# Patient Record
Sex: Female | Born: 1960 | Race: White | Hispanic: No | Marital: Married | Smoking: Former smoker
Health system: Southern US, Community
[De-identification: ages and names within clinical notes are randomized; demographics above are authoritative.]

## PROBLEM LIST (undated history)

## (undated) DIAGNOSIS — G4733 Obstructive sleep apnea (adult) (pediatric): Secondary | ICD-10-CM

## (undated) DIAGNOSIS — E785 Hyperlipidemia, unspecified: Secondary | ICD-10-CM

## (undated) DIAGNOSIS — I4891 Unspecified atrial fibrillation: Secondary | ICD-10-CM

## (undated) HISTORY — PX: TONSILLECTOMY: SUR1361

## (undated) HISTORY — PX: WISDOM TOOTH EXTRACTION: SHX21

## (undated) HISTORY — PX: BREAST EXCISIONAL BIOPSY: SUR124

## (undated) HISTORY — DX: Hyperlipidemia, unspecified: E78.5

## (undated) HISTORY — PX: TUBAL LIGATION: SHX77

---

## 2020-01-12 ENCOUNTER — Ambulatory Visit (INDEPENDENT_AMBULATORY_CARE_PROVIDER_SITE_OTHER): Payer: 59 | Admitting: Primary Care

## 2020-01-12 ENCOUNTER — Encounter (INDEPENDENT_AMBULATORY_CARE_PROVIDER_SITE_OTHER): Payer: Self-pay | Admitting: Primary Care

## 2020-01-12 ENCOUNTER — Other Ambulatory Visit: Payer: Self-pay

## 2020-01-12 VITALS — BP 135/82 | HR 74 | Temp 97.3°F | Ht 66.5 in | Wt 300.6 lb

## 2020-01-12 DIAGNOSIS — Z1211 Encounter for screening for malignant neoplasm of colon: Secondary | ICD-10-CM

## 2020-01-12 DIAGNOSIS — Z23 Encounter for immunization: Secondary | ICD-10-CM | POA: Diagnosis not present

## 2020-01-12 DIAGNOSIS — Z1231 Encounter for screening mammogram for malignant neoplasm of breast: Secondary | ICD-10-CM

## 2020-01-12 DIAGNOSIS — R030 Elevated blood-pressure reading, without diagnosis of hypertension: Secondary | ICD-10-CM

## 2020-01-12 DIAGNOSIS — Z0000 Encounter for general adult medical examination without abnormal findings: Secondary | ICD-10-CM

## 2020-01-12 NOTE — Patient Instructions (Addendum)
Influenza, Adult Influenza is also called "the flu." It is an infection in the lungs, nose, and throat (respiratory tract). It is caused by a virus. The flu causes symptoms that are similar to symptoms of a cold. It also causes a high fever and body aches. The flu spreads easily from person to person (is contagious). Getting a flu shot (influenza vaccination) every year is the best way to prevent the flu. What are the causes? This condition is caused by the influenza virus. You can get the virus by:  Breathing in droplets that are in the air from the cough or sneeze of a person who has the virus.  Touching something that has the virus on it (is contaminated) and then touching your mouth, nose, or eyes. What increases the risk? Certain things may make you more likely to get the flu. These include:  Not washing your hands often.  Having close contact with many people during cold and flu season.  Touching your mouth, eyes, or nose without first washing your hands.  Not getting a flu shot every year. You may have a higher risk for the flu, along with serious problems such as a lung infection (pneumonia), if you:  Are older than 65.  Are pregnant.  Have a weakened disease-fighting system (immune system) because of a disease or taking certain medicines.  Have a long-term (chronic) illness, such as: ? Heart, kidney, or lung disease. ? Diabetes. ? Asthma.  Have a liver disorder.  Are very overweight (morbidly obese).  Have anemia. This is a condition that affects your red blood cells. What are the signs or symptoms? Symptoms usually begin suddenly and last 4-14 days. They may include:  Fever and chills.  Headaches, body aches, or muscle aches.  Sore throat.  Cough.  Runny or stuffy (congested) nose.  Chest discomfort.  Not wanting to eat as much as normal (poor appetite).  Weakness or feeling tired (fatigue).  Dizziness.  Feeling sick to your stomach (nauseous) or  throwing up (vomiting). How is this treated? If the flu is found early, you can be treated with medicine that can help reduce how bad the illness is and how long it lasts (antiviral medicine). This may be given by mouth (orally) or through an IV tube. Taking care of yourself at home can help your symptoms get better. Your doctor may suggest:  Taking over-the-counter medicines.  Drinking plenty of fluids. The flu often goes away on its own. If you have very bad symptoms or other problems, you may be treated in a hospital. Follow these instructions at home:     Activity  Rest as needed. Get plenty of sleep.  Stay home from work or school as told by your doctor. ? Do not leave home until you do not have a fever for 24 hours without taking medicine. ? Leave home only to visit your doctor. Eating and drinking  Take an ORS (oral rehydration solution). This is a drink that is sold at pharmacies and stores.  Drink enough fluid to keep your pee (urine) pale yellow.  Drink clear fluids in small amounts as you are able. Clear fluids include: ? Water. ? Ice chips. ? Fruit juice that has water added (diluted fruit juice). ? Low-calorie sports drinks.  Eat bland, easy-to-digest foods in small amounts as you are able. These foods include: ? Bananas. ? Applesauce. ? Rice. ? Lean meats. ? Toast. ? Crackers.  Do not eat or drink: ? Fluids that have a lot   of sugar or caffeine. ? Alcohol. ? Spicy or fatty foods. General instructions  Take over-the-counter and prescription medicines only as told by your doctor.  Use a cool mist humidifier to add moisture to the air in your home. This can make it easier for you to breathe.  Cover your mouth and nose when you cough or sneeze.  Wash your hands with soap and water often, especially after you cough or sneeze. If you cannot use soap and water, use alcohol-based hand sanitizer.  Keep all follow-up visits as told by your doctor. This is  important. How is this prevented?   Get a flu shot every year. You may get the flu shot in late summer, fall, or winter. Ask your doctor when you should get your flu shot.  Avoid contact with people who are sick during fall and winter (cold and flu season). Contact a doctor if:  You get new symptoms.  You have: ? Chest pain. ? Watery poop (diarrhea). ? A fever.  Your cough gets worse.  You start to have more mucus.  You feel sick to your stomach.  You throw up. Get help right away if you:  Have shortness of breath.  Have trouble breathing.  Have skin or nails that turn a bluish color.  Have very bad pain or stiffness in your neck.  Get a sudden headache.  Get sudden pain in your face or ear.  Cannot eat or drink without throwing up. Summary  Influenza ("the flu") is an infection in the lungs, nose, and throat. It is caused by a virus.  Take over-the-counter and prescription medicines only as told by your doctor.  Getting a flu shot every year is the best way to avoid getting the flu. This information is not intended to replace advice given to you by your health care provider. Make sure you discuss any questions you have with your health care provider. Document Revised: 06/24/2017 Document Reviewed: 06/24/2017 Elsevier Patient Education  [employer].  Preventing Hypertension Hypertension, commonly called high blood pressure, is when the force of blood pumping through the arteries is too strong. Arteries are blood vessels that carry blood from the heart throughout the body. Over time, hypertension can damage the arteries and decrease blood flow to important parts of the body, including the brain, heart, and kidneys. Often, hypertension does not cause symptoms until blood pressure is very high. For this reason, it is important to have your blood pressure checked on a regular basis. Hypertension can often be prevented with diet and lifestyle changes. If you  already have hypertension, you can control it with diet and lifestyle changes, as well as medicine. What nutrition changes can be made? Maintain a healthy diet. This includes:  Eating less salt (sodium). Ask your health care provider how much sodium is safe for you to have. The general recommendation is to consume less than 1 tsp (2,300 mg) of sodium a day. ? Do not add salt to your food. ? Choose low-sodium options when grocery shopping and eating out.  Limiting fats in your diet. You can do this by eating low-fat or fat-free dairy products and by eating less red meat.  Eating more fruits, vegetables, and whole grains. Make a goal to eat: ? 1-2 cups of fresh fruits and vegetables each day. ? 3-4 servings of whole grains each day.  Avoiding foods and beverages that have added sugars.  Eating fish that contain healthy fats (omega-3 fatty acids), such as mackerel or salmon.  If you need help putting together a healthy eating plan, try the DASH diet. This diet is high in fruits, vegetables, and whole grains. It is low in sodium, red meat, and added sugars. DASH stands for Dietary Approaches to Stop Hypertension. What lifestyle changes can be made?   Lose weight if you are overweight. Losing just 3?5% of your body weight can help prevent or control hypertension. ? For example, if your present weight is 200 lb (91 kg), a loss of 3-5% of your weight means losing 6-10 lb (2.7-4.5 kg). ? Ask your health care provider to help you with a diet and exercise plan to safely lose weight.  Get enough exercise. Do at least 150 minutes of moderate-intensity exercise each week. ? You could do this in short exercise sessions several times a day, or you could do longer exercise sessions a few times a week. For example, you could take a brisk 10-minute walk or bike ride, 3 times a day, for 5 days a week.  Find ways to reduce stress, such as exercising, meditating, listening to music, or taking a yoga class. If  you need help reducing stress, ask your health care provider.  Do not smoke. This includes e-cigarettes. Chemicals in tobacco and nicotine products raise your blood pressure each time you smoke. If you need help quitting, ask your health care provider.  Avoid alcohol. If you drink alcohol, limit alcohol intake to no more than 1 drink a day for nonpregnant women and 2 drinks a day for men. One drink equals 12 oz of beer, 5 oz of wine, or 1 oz of hard liquor. Why are these changes important? Diet and lifestyle changes can help you prevent hypertension, and they may make you feel better overall and improve your quality of life. If you have hypertension, making these changes will help you control it and help prevent major complications, such as:  Hardening and narrowing of arteries that supply blood to: ? Your heart. This can cause a heart attack. ? Your brain. This can cause a stroke. ? Your kidneys. This can cause kidney failure.  Stress on your heart muscle, which can cause heart failure. What can I do to lower my risk?  Work with your health care provider to make a hypertension prevention plan that works for you. Follow your plan and keep all follow-up visits as told by your health care provider.  Learn how to check your blood pressure at home. Make sure that you know your personal target blood pressure, as told by your health care provider. How is this treated? In addition to diet and lifestyle changes, your health care provider may recommend medicines to help lower your blood pressure. You may need to try a few different medicines to find what works best for you. You also may need to take more than one medicine. Take over-the-counter and prescription medicines only as told by your health care provider. Where to find support Your health care provider can help you prevent hypertension and help you keep your blood pressure at a healthy level. Your local hospital or your community may also  provide support services and prevention programs. The American Heart Association offers an online support network at: CheapBootlegs.com.cy Where to find more information Learn more about hypertension from:  [place], Lung, and Blood Institute: ElectronicHangman.is  Centers for Disease Control and Prevention: https://ingram.com/  American Academy of Family Physicians: http://familydoctor.org/familydoctor/en/diseases-conditions/high-blood-pressure.printerview.all.html Learn more about the DASH diet from:  [place], Lung, and Blood Institute: https://www.reyes.com/  Contact a health care provider if:  You think you are having a reaction to medicines you have taken.  You have recurrent headaches or feel dizzy.  You have swelling in your ankles.  You have trouble with your vision. Summary  Hypertension often does not cause any symptoms until blood pressure is very high. It is important to get your blood pressure checked regularly.  Diet and lifestyle changes are the most important steps in preventing hypertension.  By keeping your blood pressure in a healthy range, you can prevent complications like heart attack, heart failure, stroke, and kidney failure.  Work with your health care provider to make a hypertension prevention plan that works for you. This information is not intended to replace advice given to you by your health care provider. Make sure you discuss any questions you have with your health care provider. Document Revised: 04/30/2018 Document Reviewed: 09/17/2015 Elsevier Patient Education  2020 [employer].

## 2020-01-12 NOTE — Progress Notes (Signed)
New Patient Office Visit  Subjective:  Patient ID: Brittany Bauer, female    DOB: March 03, 1960  Age: 59 y.o. MRN: [id number]  CC:  Chief Complaint  Patient presents with  . New Patient (Initial Visit)    Weight     HPI [name] is a 59 is a morbid obese female who presents for establishment of care and her main concern is her weight but will defer to next year.  History reviewed. No pertinent past medical history.   Social History   Socioeconomic History  . Marital status: Married    Spouse name: Not on file  . Number of children: Not on file  . Years of education: Not on file  . Highest education level: Not on file  Occupational History  . Not on file  Tobacco Use  . Smoking status: Never Smoker  . Smokeless tobacco: Never Used  Substance and Sexual Activity  . Alcohol use: Yes  . Drug use: Never  . Sexual activity: Yes  Other Topics Concern  . Not on file  Social History Narrative  . Not on file   Social Determinants of Health   Financial Resource Strain: Not on file  Food Insecurity: Not on file  Transportation Needs: Not on file  Physical Activity: Not on file  Stress: Not on file  Social Connections: Not on file  Intimate Partner Violence: Not on file    ROS Review of Systems  All other systems reviewed and are negative.   Objective:   Today's Vitals: BP 135/82 (BP Location: Right Arm, Patient Position: Sitting, Cuff Size: Large)   Pulse 74   Temp (!) 97.3 F (36.3 C) (Temporal)   Ht 5' 6.5" (1.689 m)   Wt (!) 300 lb 9.6 oz (136.4 kg)   SpO2 93%   BMI 47.79 kg/m   Physical Exam Vitals reviewed.  Constitutional:      Appearance: She is obese.     Comments: morbid  HENT:     Head: Normocephalic.     Right Ear: Tympanic membrane and external ear normal.     Left Ear: Tympanic membrane and external ear normal.     Nose: Nose normal.  Eyes:     Extraocular Movements: Extraocular movements intact.     Pupils: Pupils are equal,  round, and reactive to light.  Cardiovascular:     Rate and Rhythm: Normal rate and regular rhythm.  Pulmonary:     Effort: Pulmonary effort is normal.     Breath sounds: Normal breath sounds.  Abdominal:     General: Bowel sounds are normal. There is distension.     Palpations: Abdomen is soft.  Musculoskeletal:        General: Normal range of motion.     Cervical back: Normal range of motion and neck supple.  Skin:    General: Skin is warm and dry.  Neurological:     Mental Status: She is alert and oriented to person, place, and time.  Psychiatric:        Mood and Affect: Mood normal.        Behavior: Behavior normal.        Thought Content: Thought content normal.        Judgment: Judgment normal.     Assessment & Plan:   Brittany Bauer was seen today for new patient (initial visit).  Diagnoses and all orders for this visit: Brittany Bauer was seen today for new patient (initial visit).  Diagnoses and all orders  for this visit:  Encounter for medical examination to establish care Establish care  -     CBC with Differential -     CMP14+EGFR  Colon cancer screening -     Ambulatory referral to Gastroenterology  Morbid obesity ([place]) Morbid Obesity is 30-39 indicating an excess in caloric intake or underlining conditions. This may lead to other co-morbidities. HTN, DM respiratory complication. Lifestyle modifications of diet and exercise may reduce obesity.  -     Lipid Panel -     TSH + free T4  Encounter for screening mammogram for malignant neoplasm of breast Refer for mammogram   Elevated blood pressure reading without diagnosis of hypertension. Discussed low sodium diet  -     CBC with Differential -     CMP14+EGFR  Need for prophylactic vaccination and inoculation against influenza completed  Follow-up: Return in about 1 month (around 02/12/2020) for pap/weight.   Kerin Perna, NP

## 2020-01-13 LAB — CBC WITH DIFFERENTIAL/PLATELET
Basophils Absolute: 0 10*3/uL (ref 0.0–0.2)
Basos: 1 %
EOS (ABSOLUTE): 0.1 10*3/uL (ref 0.0–0.4)
Eos: 1 %
Hematocrit: 48.5 % — ABNORMAL HIGH (ref 34.0–46.6)
Hemoglobin: 15.8 g/dL (ref 11.1–15.9)
Immature Grans (Abs): 0 10*3/uL (ref 0.0–0.1)
Immature Granulocytes: 0 %
Lymphocytes Absolute: 2.2 10*3/uL (ref 0.7–3.1)
Lymphs: 33 %
MCH: 27.4 pg (ref 26.6–33.0)
MCHC: 32.6 g/dL (ref 31.5–35.7)
MCV: 84 fL (ref 79–97)
Monocytes Absolute: 0.4 10*3/uL (ref 0.1–0.9)
Monocytes: 5 %
Neutrophils Absolute: 4.1 10*3/uL (ref 1.4–7.0)
Neutrophils: 60 %
Platelets: 238 10*3/uL (ref 150–450)
RBC: 5.76 x10E6/uL — ABNORMAL HIGH (ref 3.77–5.28)
RDW: 13.6 % (ref 11.7–15.4)
WBC: 6.9 10*3/uL (ref 3.4–10.8)

## 2020-01-13 LAB — LIPID PANEL
Chol/HDL Ratio: 4.4 ratio (ref 0.0–4.4)
Cholesterol, Total: 192 mg/dL (ref 100–199)
HDL: 44 mg/dL (ref >39)
LDL Chol Calc (NIH): 117 mg/dL — ABNORMAL HIGH (ref 0–99)
Triglycerides: 178 mg/dL — ABNORMAL HIGH (ref 0–149)
VLDL Cholesterol Cal: 31 mg/dL (ref 5–40)

## 2020-01-13 LAB — CMP14+EGFR
ALT: 16 IU/L (ref 0–32)
AST: 16 IU/L (ref 0–40)
Albumin/Globulin Ratio: 1.7 (ref 1.2–2.2)
Albumin: 4.2 g/dL (ref 3.8–4.9)
Alkaline Phosphatase: 90 IU/L (ref 44–121)
BUN/Creatinine Ratio: 18 (ref 12–28)
BUN: 16 mg/dL (ref 8–27)
Bilirubin Total: 0.5 mg/dL (ref 0.0–1.2)
CO2: 23 mmol/L (ref 20–29)
Calcium: 9.1 mg/dL (ref 8.7–10.3)
Chloride: 104 mmol/L (ref 96–106)
Creatinine, Ser: 0.9 mg/dL (ref 0.57–1.00)
GFR calc Af Amer: 80 mL/min/{1.73_m2} (ref >59)
GFR calc non Af Amer: 70 mL/min/{1.73_m2} (ref >59)
Globulin, Total: 2.5 g/dL (ref 1.5–4.5)
Glucose: 108 mg/dL — ABNORMAL HIGH (ref 65–99)
Potassium: 4.8 mmol/L (ref 3.5–5.2)
Sodium: 141 mmol/L (ref 134–144)
Total Protein: 6.7 g/dL (ref 6.0–8.5)

## 2020-01-13 LAB — TSH+FREE T4
Free T4: 1.27 ng/dL (ref 0.82–1.77)
TSH: 4.33 u[IU]/mL (ref 0.450–4.500)

## 2020-01-20 ENCOUNTER — Other Ambulatory Visit (INDEPENDENT_AMBULATORY_CARE_PROVIDER_SITE_OTHER): Payer: Self-pay | Admitting: Primary Care

## 2020-01-20 DIAGNOSIS — E782 Mixed hyperlipidemia: Secondary | ICD-10-CM

## 2020-01-20 MED ORDER — ATORVASTATIN CALCIUM 20 MG PO TABS
20.0000 mg | ORAL_TABLET | Freq: Every day | ORAL | 3 refills | Status: DC
Start: 2020-01-20 — End: 2020-08-16

## 2020-01-21 HISTORY — PX: COLONOSCOPY: SHX174

## 2020-01-26 ENCOUNTER — Encounter: Payer: Self-pay | Admitting: Gastroenterology

## 2020-02-13 ENCOUNTER — Ambulatory Visit (INDEPENDENT_AMBULATORY_CARE_PROVIDER_SITE_OTHER): Payer: 59 | Admitting: Physician Assistant

## 2020-02-13 ENCOUNTER — Other Ambulatory Visit: Payer: Self-pay

## 2020-02-13 ENCOUNTER — Encounter (INDEPENDENT_AMBULATORY_CARE_PROVIDER_SITE_OTHER): Payer: Self-pay

## 2020-02-13 ENCOUNTER — Other Ambulatory Visit (HOSPITAL_COMMUNITY)
Admission: RE | Admit: 2020-02-13 | Discharge: 2020-02-13 | Disposition: A | Discharge status: Home / Self Care | Payer: 59 | Source: Ambulatory Visit | Attending: Primary Care | Admitting: Primary Care

## 2020-02-13 VITALS — BP 121/80 | HR 118 | Temp 98.7°F | Resp 20 | Ht 67.0 in | Wt 300.0 lb

## 2020-02-13 DIAGNOSIS — Z124 Encounter for screening for malignant neoplasm of cervix: Secondary | ICD-10-CM | POA: Diagnosis not present

## 2020-02-13 DIAGNOSIS — Z01419 Encounter for gynecological examination (general) (routine) without abnormal findings: Secondary | ICD-10-CM

## 2020-02-13 DIAGNOSIS — E66813 Obesity, class 3: Secondary | ICD-10-CM | POA: Insufficient documentation

## 2020-02-13 DIAGNOSIS — Z1231 Encounter for screening mammogram for malignant neoplasm of breast: Secondary | ICD-10-CM | POA: Diagnosis not present

## 2020-02-13 DIAGNOSIS — Z6841 Body Mass Index (BMI) 40.0 and over, adult: Secondary | ICD-10-CM

## 2020-02-13 DIAGNOSIS — L309 Dermatitis, unspecified: Secondary | ICD-10-CM

## 2020-02-13 MED ORDER — NYSTATIN-TRIAMCINOLONE 100000-0.1 UNIT/GM-% EX OINT: 1.0000 "application " | TOPICAL_OINTMENT | Freq: Two times a day (BID) | CUTANEOUS | 0 refills | Status: DC

## 2020-02-13 NOTE — Patient Instructions (Addendum)
Mindfulness, I encourage you to eat on distracted, no TV, no social media  Ask, Am I hungry?  Can be very helpful to determine if you are truly hungry, or you just thirsty, are you bored, looking for rest?  My Fitness Pal can be very helpful for tracking calorie intake  Referral for your mammogram, we will call you with your results from your Pap smear.  Kennieth Rad, PA-C Physician Assistant [place] [email or web address]    Calorie Counting for Weight Loss Calories are units of energy. Your body needs a certain number of calories from food to keep going throughout the day. When you eat or drink more calories than your body needs, your body stores the extra calories mostly as fat. When you eat or drink fewer calories than your body needs, your body burns fat to get the energy it needs. Calorie counting means keeping track of how many calories you eat and drink each day. Calorie counting can be helpful if you need to lose weight. If you eat fewer calories than your body needs, you should lose weight. Ask your health care provider what a healthy weight is for you. For calorie counting to work, you will need to eat the right number of calories each day to lose a healthy amount of weight per week. A dietitian can help you figure out how many calories you need in a day and will suggest ways to reach your calorie goal.  A healthy amount of weight to lose each week is usually 1-2 lb (0.5-0.9 kg). This usually means that your daily calorie intake should be reduced by 500-750 calories.  Eating 1,200-1,500 calories a day can help most women lose weight.  Eating 1,500-1,800 calories a day can help most men lose weight. What do I need to know about calorie counting? Work with your health care provider or dietitian to determine how many calories you should get each day. To meet your daily calorie goal, you will need to:  Find out how many  calories are in each food that you would like to eat. Try to do this before you eat.  Decide how much of the food you plan to eat.  Keep a food log. Do this by writing down what you ate and how many calories it had. To successfully lose weight, it is important to balance calorie counting with a healthy lifestyle that includes regular activity. Where do I find calorie information? The number of calories in a food can be found on a Nutrition Facts label. If a food does not have a Nutrition Facts label, try to look up the calories online or ask your dietitian for help. Remember that calories are listed per serving. If you choose to have more than one serving of a food, you will have to multiply the calories per serving by the number of servings you plan to eat. For example, the label on a package of bread might say that a serving size is 1 slice and that there are 90 calories in a serving. If you eat 1 slice, you will have eaten 90 calories. If you eat 2 slices, you will have eaten 180 calories.   How do I keep a food log? After each time that you eat, record the following in your food log as soon as possible:  What you ate. Be sure to include toppings, sauces, and other extras on the food.  How much you ate. This can be measured in cups, ounces, or  number of items.  How many calories were in each food and drink.  The total number of calories in the food you ate. Keep your food log near you, such as in a pocket-sized notebook or on an app or website on your mobile phone. Some programs will calculate calories for you and show you how many calories you have left to meet your daily goal. What are some portion-control tips?  Know how many calories are in a serving. This will help you know how many servings you can have of a certain food.  Use a measuring cup to measure serving sizes. You could also try weighing out portions on a kitchen scale. With time, you will be able to estimate serving sizes  for some foods.  Take time to put servings of different foods on your favorite plates or in your favorite bowls and cups so you know what a serving looks like.  Try not to eat straight from a food's packaging, such as from a bag or box. Eating straight from the package makes it hard to see how much you are eating and can lead to overeating. Put the amount you would like to eat in a cup or on a plate to make sure you are eating the right portion.  Use smaller plates, glasses, and bowls for smaller portions and to prevent overeating.  Try not to multitask. For example, avoid watching TV or using your computer while eating. If it is time to eat, sit down at a table and enjoy your food. This will help you recognize when you are full. It will also help you be more mindful of what and how much you are eating. What are tips for following this plan? Reading food labels  Check the calorie count compared with the serving size. The serving size may be smaller than what you are used to eating.  Check the source of the calories. Try to choose foods that are high in protein, fiber, and vitamins, and low in saturated fat, trans fat, and sodium. Shopping  Read nutrition labels while you shop. This will help you make healthy decisions about which foods to buy.  Pay attention to nutrition labels for low-fat or fat-free foods. These foods sometimes have the same number of calories or more calories than the full-fat versions. They also often have added sugar, starch, or salt to make up for flavor that was removed with the fat.  Make a grocery list of lower-calorie foods and stick to it. Cooking  Try to cook your favorite foods in a healthier way. For example, try baking instead of frying.  Use low-fat dairy products. Meal planning  Use more fruits and vegetables. One-half of your plate should be fruits and vegetables.  Include lean proteins, such as chicken, [place], and fish. Lifestyle Each week, aim to  do one of the following:  150 minutes of moderate exercise, such as walking.  75 minutes of vigorous exercise, such as running. General information  Know how many calories are in the foods you eat most often. This will help you calculate calorie counts faster.  Find a way of tracking calories that works for you. Get creative. Try different apps or programs if writing down calories does not work for you. What foods should I eat?  Eat nutritious foods. It is better to have a nutritious, high-calorie food, such as an avocado, than a food with few nutrients, such as a bag of potato chips.  Use your calories on foods  and drinks that will fill you up and will not leave you hungry soon after eating. ? Examples of foods that fill you up are nuts and nut butters, vegetables, lean proteins, and high-fiber foods such as whole grains. High-fiber foods are foods with more than 5 g of fiber per serving.  Pay attention to calories in drinks. Low-calorie drinks include water and unsweetened drinks. The items listed above may not be a complete list of foods and beverages you can eat. Contact a dietitian for more information.   What foods should I limit? Limit foods or drinks that are not good sources of vitamins, minerals, or protein or that are high in unhealthy fats. These include:  Candy.  Other sweets.  Sodas, specialty coffee drinks, alcohol, and juice. The items listed above may not be a complete list of foods and beverages you should avoid. Contact a dietitian for more information. How do I count calories when eating out?  Pay attention to portions. Often, portions are much larger when eating out. Try these tips to keep portions smaller: ? Consider sharing a meal instead of getting your own. ? If you get your own meal, eat only half of it. Before you start eating, ask for a container and put half of your meal into it. ? When available, consider ordering smaller portions from the menu instead  of full portions.  Pay attention to your food and drink choices. Knowing the way food is cooked and what is included with the meal can help you eat fewer calories. ? If calories are listed on the menu, choose the lower-calorie options. ? Choose dishes that include vegetables, fruits, whole grains, low-fat dairy products, and lean proteins. ? Choose items that are boiled, broiled, grilled, or steamed. Avoid items that are buttered, battered, fried, or served with cream sauce. Items labeled as crispy are usually fried, unless stated otherwise. ? Choose water, low-fat milk, unsweetened iced tea, or other drinks without added sugar. If you want an alcoholic beverage, choose a lower-calorie option, such as a glass of wine or light beer. ? Ask for dressings, sauces, and syrups on the side. These are usually high in calories, so you should limit the amount you eat. ? If you want a salad, choose a garden salad and ask for grilled meats. Avoid extra toppings such as bacon, cheese, or fried items. Ask for the dressing on the side, or ask for olive oil and vinegar or lemon to use as dressing.  Estimate how many servings of a food you are given. Knowing serving sizes will help you be aware of how much food you are eating at restaurants. Where to find more information  Centers for Disease Control and Prevention: http://www.wolf.info/  [place]. Department of Agriculture: http://www.wilson-[place].org/ Summary  Calorie counting means keeping track of how many calories you eat and drink each day. If you eat fewer calories than your body needs, you should lose weight.  A healthy amount of weight to lose per week is usually 1-2 lb (0.5-0.9 kg). This usually means reducing your daily calorie intake by 500-750 calories.  The number of calories in a food can be found on a Nutrition Facts label. If a food does not have a Nutrition Facts label, try to look up the calories online or ask your dietitian for help.  Use smaller plates, glasses, and  bowls for smaller portions and to prevent overeating.  Use your calories on foods and drinks that will fill you up and not leave you  hungry shortly after a meal. This information is not intended to replace advice given to you by your health care provider. Make sure you discuss any questions you have with your health care provider. Document Revised: 02/17/2019 Document Reviewed: 02/17/2019 Elsevier Patient Education  2021 [employer].

## 2020-02-13 NOTE — Progress Notes (Unsigned)
Subjective:     Established Patient Office Visit  Subjective:  Patient ID: Brittany Bauer, female    DOB: 1960/11/05  Age: 60 y.o. MRN: [id number]  CC:  Chief Complaint  Patient presents with  . Gynecologic Exam    HPI Brittany Bauer is a 60 y.o. woman who comes in today for a  pap smear only. Her most recent annual exam was 3 to 4 years ago per patient self history.  Denies any previous abnormal Paps.no complaints today.  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  Reports that she would like to lose approximately 100 pounds.  Reports that she has tried and failed many diets in the past, states that she is drinking approximately 60 ounces of water a day, is sleeping 7 to 8 hours, is interested in starting some type of exercise.   History reviewed. No pertinent past medical history.  History reviewed. No pertinent surgical history.  History reviewed. No pertinent family history.  Social History   Socioeconomic History  . Marital status: Married    Spouse name: Not on file  . Number of children: Not on file  . Years of education: Not on file  . Highest education level: Not on file  Occupational History  . Not on file  Tobacco Use  . Smoking status: Never Smoker  . Smokeless tobacco: Never Used  Substance and Sexual Activity  . Alcohol use: Yes  . Drug use: Never  . Sexual activity: Yes  Other Topics Concern  . Not on file  Social History Narrative  . Not on file   Social Determinants of Health   Financial Resource Strain: Not on file  Food Insecurity: Not on file  Transportation Needs: Not on file  Physical Activity: Not on file  Stress: Not on file  Social Connections: Not on file  Intimate Partner Violence: Not on file    Outpatient Medications Prior to Visit  Medication Sig Dispense Refill  . atorvastatin (LIPITOR) 20 MG tablet Take 1 tablet (20 mg total) by mouth  daily. 90 tablet 3   No facility-administered medications prior to visit.    No Known Allergies  ROS Review of Systems  Constitutional: Negative.   HENT: Negative.   Eyes: Negative.   Respiratory: Negative.   Cardiovascular: Negative.   Gastrointestinal: Negative.   Endocrine: Negative.   Genitourinary: Negative for difficulty urinating, genital sores and vaginal discharge.  Musculoskeletal: Negative.   Skin: Negative.   Allergic/Immunologic: Negative.   Neurological: Negative.   Hematological: Negative.   Psychiatric/Behavioral: Negative.       Objective:    Physical Exam Exam conducted with a chaperone present.  Constitutional:      General: She is not in acute distress.    Appearance: Normal appearance. She is obese. She is not ill-appearing.  HENT:     Head: Normocephalic and atraumatic.     Right Ear: External ear normal.     Left Ear: External ear normal.     Nose: Nose normal.     Mouth/Throat:     Mouth: Mucous membranes are moist.     Pharynx: Oropharynx is clear.  Eyes:     Extraocular Movements: Extraocular movements intact.     Conjunctiva/sclera: Conjunctivae normal.     Pupils: Pupils are equal, round, and reactive to light.  Cardiovascular:     Rate and Rhythm: Normal rate and regular rhythm.  Pulses: Normal pulses.     Heart sounds: Normal heart sounds.  Pulmonary:     Effort: Pulmonary effort is normal.     Breath sounds: Normal breath sounds.  Abdominal:     General: Abdomen is flat.     Palpations: Abdomen is soft.  Genitourinary:    General: Normal vulva.     Pubic Area: Rash present.     Labia:        Right: Rash present.        Left: Rash present.      Vagina: Normal. No vaginal discharge.     Cervix: Normal.     Uterus: Normal.      Rectum: Normal. No external hemorrhoid.        Comments: Generalized rash inner thighs; erythema, non-purulent  Unable to palpate ovaries Musculoskeletal:        General: Normal range of  motion.     Cervical back: Normal range of motion and neck supple.  Skin:    General: Skin is warm and dry.  Neurological:     General: No focal deficit present.     Mental Status: She is alert. Mental status is at baseline. She is disoriented.  Psychiatric:        Mood and Affect: Mood normal.        Behavior: Behavior normal.        Thought Content: Thought content normal.        Judgment: Judgment normal.     BP 121/80 (BP Location: Left Arm, Patient Position: Sitting, Cuff Size: Large)   Pulse (!) 118   Temp 98.7 F (37.1 C) (Oral)   Resp 20   Ht 5\' 7"  (1.702 m)   Wt 300 lb (136.1 kg)   SpO2 90%   BMI 46.99 kg/m  Wt Readings from Last 3 Encounters:  02/13/20 300 lb (136.1 kg)  01/12/20 (!) 300 lb 9.6 oz (136.4 kg)     Health Maintenance Due  Topic Date Due  . HIV Screening  Never done  . TETANUS/TDAP  Never done  . PAP SMEAR-Modifier  Never done  . COLONOSCOPY (Pts 45-22yrs Insurance coverage will need to be confirmed)  Never done  . MAMMOGRAM  Never done  . INFLUENZA VACCINE  Never done    There are no preventive care reminders to display for this patient.  Lab Results  Component Value Date   TSH 4.330 01/12/2020   Lab Results  Component Value Date   WBC 6.9 01/12/2020   HGB 15.8 01/12/2020   HCT 48.5 (H) 01/12/2020   MCV 84 01/12/2020   PLT 238 01/12/2020   Lab Results  Component Value Date   NA 141 01/12/2020   K 4.8 01/12/2020   CO2 23 01/12/2020   GLUCOSE 108 (H) 01/12/2020   BUN 16 01/12/2020   CREATININE 0.90 01/12/2020   BILITOT 0.5 01/12/2020   ALKPHOS 90 01/12/2020   AST 16 01/12/2020   ALT 16 01/12/2020   PROT 6.7 01/12/2020   ALBUMIN 4.2 01/12/2020   CALCIUM 9.1 01/12/2020   Lab Results  Component Value Date   CHOL 192 01/12/2020   Lab Results  Component Value Date   HDL 44 01/12/2020   Lab Results  Component Value Date   LDLCALC 117 (H) 01/12/2020   Lab Results  Component Value Date   TRIG 178 (H) 01/12/2020   Lab  Results  Component Value Date   CHOLHDL 4.4 01/12/2020   No results found for: HGBA1C  Assessment & Plan:   Problem List Items Addressed This Visit      Other   Morbid obesity ([place])    Other Visit Diagnoses    Encounter for screening for cervical cancer    -  Primary   Relevant Orders   Cytology - PAP([place])   Screening for malignant neoplasm of cervix       Encounter for screening mammogram for malignant neoplasm of breast       Relevant Orders   MM DIGITAL SCREENING BILATERAL   Dermatitis       Relevant Medications   nystatin-triamcinolone ointment (MYCOLOG)    1. Encounter for screening for cervical cancer Patient education given on weight loss techniques, including mindfulness, increasing water intake, increasing activity. - Cytology - PAP([place])  2. Screening for malignant neoplasm of cervix   3. Morbid obesity ([place])   4. Encounter for screening mammogram for malignant neoplasm of breast  - MM DIGITAL SCREENING BILATERAL; Future  5. Dermatitis Patient education given, keep area clean and dry - nystatin-triamcinolone ointment (MYCOLOG); Apply 1 application topically 2 (two) times daily.  Dispense: 30 g; Refill: 0   Meds ordered this encounter  Medications  . nystatin-triamcinolone ointment (MYCOLOG)    Sig: Apply 1 application topically 2 (two) times daily.    Dispense:  30 g    Refill:  0    Order Specific Question:   Supervising Provider    Answer:   Noralyn Pick    Follow-up: Return in about 6 months (around 08/12/2020).    Loraine Grip Lene Mckay, PA-C

## 2020-02-13 NOTE — Progress Notes (Signed)
Patient presents for PAP and weight check. Patient has eaten today. Patient has not taken any medication today. Patient denies any pain at this time.

## 2020-02-15 LAB — CYTOLOGY - PAP
Chlamydia: NEGATIVE
Comment: NEGATIVE
Comment: NEGATIVE
Comment: NORMAL
Diagnosis: NEGATIVE
Neisseria Gonorrhea: NEGATIVE
Trichomonas: NEGATIVE

## 2020-02-19 ENCOUNTER — Telehealth: Payer: Self-pay | Admitting: *Deleted

## 2020-02-19 NOTE — Telephone Encounter (Signed)
-----   Message from Kennieth Rad, [place] sent at 02/16/2020  1:16 PM EST ----- Please call patient and let her know that her Pap cytology was within normal limits, because she has never had an abnormal result, it is recommended that she have a repeat Pap completed in 3 years.

## 2020-02-19 NOTE — Telephone Encounter (Signed)
Medical Assistant left message on patient's home and cell voicemail. Voicemail states to give a call back to [place] with [facility] at [phone]. Patient is aware of PAP being normal and a recommended standard recheck of 3 years is recommended. Patient has also viewed results via mychart.

## 2020-03-05 ENCOUNTER — Other Ambulatory Visit: Payer: Self-pay

## 2020-03-05 ENCOUNTER — Ambulatory Visit (AMBULATORY_SURGERY_CENTER): Payer: Self-pay

## 2020-03-05 VITALS — Ht 67.0 in | Wt 303.0 lb

## 2020-03-05 DIAGNOSIS — Z1211 Encounter for screening for malignant neoplasm of colon: Secondary | ICD-10-CM

## 2020-03-05 MED ORDER — SUTAB 1479-225-188 MG PO TABS: 1.0000 | ORAL_TABLET | ORAL | 0 refills | Status: DC

## 2020-03-05 NOTE — Progress Notes (Signed)
No egg or soy allergy known to patient  No issues with past sedation with any surgeries or procedures No intubation problems in the past  No FH of Malignant Hyperthermia No diet pills per patient No home 02 use per patient  No blood thinners per patient  Pt denies issues with constipation  No A fib or A flutter  EMMI video via MyChart  COVID 19 guidelines implemented in PV today with Pt and RN  Pt is fully vaccinated for Covid x 2 + booster= Pt denies loose or missing teeth; Patient denies partials, capped or bonded teeth;  Patient reports dentures and dental implants (lower). Coupon given to pt in PV today, Code to Pharmacy and  NO PA's for preps discussed with pt in PV today  Discussed with pt there will be an out-of-pocket cost for prep and that varies from $0 to 70 dollars  Due to the COVID-19 pandemic we are asking patients to follow certain guidelines.  Pt aware of COVID protocols and [facility] guidelines

## 2020-03-14 ENCOUNTER — Encounter: Payer: Self-pay | Admitting: Gastroenterology

## 2020-03-19 ENCOUNTER — Telehealth: Payer: Self-pay | Admitting: Gastroenterology

## 2020-03-19 ENCOUNTER — Encounter: Payer: Self-pay | Admitting: Gastroenterology

## 2020-03-19 ENCOUNTER — Emergency Department (HOSPITAL_COMMUNITY): Payer: 59

## 2020-03-19 ENCOUNTER — Other Ambulatory Visit: Payer: Self-pay

## 2020-03-19 ENCOUNTER — Telehealth (HOSPITAL_COMMUNITY): Payer: Self-pay | Admitting: Physician Assistant

## 2020-03-19 ENCOUNTER — Emergency Department (HOSPITAL_COMMUNITY)
Admission: EM | Admit: 2020-03-19 | Discharge: 2020-03-19 | Disposition: A | Discharge status: Home / Self Care | Payer: 59 | Attending: Emergency Medicine | Admitting: Emergency Medicine

## 2020-03-19 ENCOUNTER — Encounter (HOSPITAL_COMMUNITY): Payer: Self-pay

## 2020-03-19 ENCOUNTER — Ambulatory Visit (AMBULATORY_SURGERY_CENTER): Payer: 59 | Admitting: Gastroenterology

## 2020-03-19 VITALS — BP 103/66 | HR 101 | Temp 96.2°F | Resp 10 | Ht 67.0 in | Wt 303.0 lb

## 2020-03-19 DIAGNOSIS — K635 Polyp of colon: Secondary | ICD-10-CM

## 2020-03-19 DIAGNOSIS — Z1211 Encounter for screening for malignant neoplasm of colon: Secondary | ICD-10-CM

## 2020-03-19 DIAGNOSIS — I480 Paroxysmal atrial fibrillation: Secondary | ICD-10-CM | POA: Diagnosis not present

## 2020-03-19 DIAGNOSIS — D125 Benign neoplasm of sigmoid colon: Secondary | ICD-10-CM | POA: Diagnosis not present

## 2020-03-19 DIAGNOSIS — D124 Benign neoplasm of descending colon: Secondary | ICD-10-CM

## 2020-03-19 DIAGNOSIS — D120 Benign neoplasm of cecum: Secondary | ICD-10-CM | POA: Diagnosis not present

## 2020-03-19 DIAGNOSIS — R002 Palpitations: Secondary | ICD-10-CM | POA: Diagnosis present

## 2020-03-19 DIAGNOSIS — D126 Benign neoplasm of colon, unspecified: Secondary | ICD-10-CM | POA: Diagnosis not present

## 2020-03-19 DIAGNOSIS — D123 Benign neoplasm of transverse colon: Secondary | ICD-10-CM

## 2020-03-19 LAB — CBC
HCT: 49.7 % — ABNORMAL HIGH (ref 36.0–46.0)
Hemoglobin: 15.6 g/dL — ABNORMAL HIGH (ref 12.0–15.0)
MCH: 27.3 pg (ref 26.0–34.0)
MCHC: 31.4 g/dL (ref 30.0–36.0)
MCV: 86.9 fL (ref 80.0–100.0)
Platelets: 204 10*3/uL (ref 150–400)
RBC: 5.72 MIL/uL — ABNORMAL HIGH (ref 3.87–5.11)
RDW: 14.5 % (ref 11.5–15.5)
WBC: 6.7 10*3/uL (ref 4.0–10.5)
nRBC: 0 % (ref 0.0–0.2)

## 2020-03-19 LAB — BASIC METABOLIC PANEL
Anion gap: 12 (ref 5–15)
BUN: 9 mg/dL (ref 8–23)
CO2: 24 mmol/L (ref 22–32)
Calcium: 8.8 mg/dL — ABNORMAL LOW (ref 8.9–10.3)
Chloride: 103 mmol/L (ref 98–111)
Creatinine, Ser: 0.83 mg/dL (ref 0.44–1.00)
GFR, Estimated: >60 mL/min (ref >60)
Glucose, Bld: 99 mg/dL (ref 70–99)
Potassium: 4 mmol/L (ref 3.5–5.1)
Sodium: 139 mmol/L (ref 135–145)

## 2020-03-19 MED ORDER — METOPROLOL SUCCINATE ER 50 MG PO TB24
25.0000 mg | ORAL_TABLET | Freq: Once | ORAL | Status: AC
  Administered 2020-03-19: 25 mg via ORAL
  Filled 2020-03-19: qty 1

## 2020-03-19 MED ORDER — SODIUM CHLORIDE 0.9 % IV SOLN: 500.0000 mL | Freq: Once | INTRAVENOUS | Status: DC

## 2020-03-19 MED ORDER — METOPROLOL SUCCINATE ER 25 MG PO TB24: 25.0000 mg | ORAL_TABLET | Freq: Every day | ORAL | 0 refills | Status: DC

## 2020-03-19 NOTE — Progress Notes (Signed)
Procedure end VSS. Report to RN. Dr Tarri Glenn aware of HR and rhythm will get 12 lead in PACU for confirmation.tb

## 2020-03-19 NOTE — Progress Notes (Signed)
Called to room to assist during endoscopic procedure.  Patient ID and intended procedure confirmed with present staff. Received instructions for my participation in the procedure from the performing physician.  

## 2020-03-19 NOTE — Op Note (Addendum)
[place] Patient Name: Aron Needles Procedure Date: 03/19/2020 10:53 AM MRN: [id number] Endoscopist: Thornton Park MD, MD Age: 60 Referring MD:  Date of Birth: 10-08-1960 Gender: Female Account #: [id number] Procedure:                Colonoscopy Indications:              Screening for colorectal malignant neoplasm, This                            is the patient's first colonoscopy                           No known family history of colon cancer or polyps. Medicines:                Monitored Anesthesia Care Procedure:                Pre-Anesthesia Assessment:                           - Prior to the procedure, a History and Physical                            was performed, and patient medications and                            allergies were reviewed. The patient's tolerance of                            previous anesthesia was also reviewed. The risks                            and benefits of the procedure and the sedation                            options and risks were discussed with the patient.                            All questions were answered, and informed consent                            was obtained. Prior Anticoagulants: The patient has                            taken no previous anticoagulant or antiplatelet                            agents. ASA Grade Assessment: II - A patient with                            mild systemic disease. After reviewing the risks                            and benefits, the patient was deemed in  satisfactory condition to undergo the procedure.                           After obtaining informed consent, the colonoscope                            was passed under direct vision. Throughout the                            procedure, the patient's blood pressure, pulse, and                            oxygen saturations were monitored continuously. The                            Olympus CF-HQ190  (#[id number]) Colonoscope was                            introduced through the anus and advanced to the 3                            cm into the ileum. The colonoscopy was performed                            without difficulty. The patient tolerated the                            procedure well. The quality of the bowel                            preparation was good. The terminal ileum, ileocecal                            valve, appendiceal orifice, and rectum were                            photographed. Scope In: 11:02:30 AM Scope Out: 11:24:00 AM Scope Withdrawal Time: 0 hours 19 minutes 59 seconds  Total Procedure Duration: 0 hours 21 minutes 30 seconds  Findings:                 The perianal and digital rectal examinations were                            normal.                           Non-bleeding internal hemorrhoids were found.                           A 15 mm polyp was found in the sigmoid colon, 28 cm                            from the anal verge. The polyp was pedunculated.  The polyp was removed with a hot snare. Resection                            and retrieval were complete. Estimated blood loss                            was minimal.                           A 12 mm polyp was found in the sigmoid colon. The                            polyp was semi-pedunculated. The polyp was removed                            with a cold snare. Resection and retrieval were                            complete. Estimated blood loss was minimal.                           A 4 mm polyp was found in the descending colon. The                            polyp was sessile. The polyp was removed with a                            cold snare. Resection and retrieval were complete.                            Estimated blood loss was minimal.                           A 3 mm polyp was found in the splenic flexure. The                            polyp was sessile. The  polyp was removed with a                            cold snare. Resection and retrieval were complete.                            Estimated blood loss was minimal.                           A 13 mm polyp was found in the hepatic flexure. The                            polyp was flat. The polyp was removed with a hot                            snare after failed resection with a cold snare.  Resection and retrieval were complete. Estimated                            blood loss was minimal.                           A 1 mm polyp was found in the cecum. The polyp was                            sessile. The polyp was removed with a cold snare.                            Resection and retrieval were complete. Estimated                            blood loss was minimal.                           The exam was otherwise without abnormality on                            direct and retroflexion views. Complications:            No immediate complications. Noted concerns for                            intermittent arrhythmia with 12 lead at the end of                            the procedure showing atrial fibrillation and                            suspected sleep apnea during the procedure.                            Estimated blood loss: Minimal. Estimated Blood Loss:     Estimated blood loss was minimal. Impression:               - Non-bleeding internal hemorrhoids.                           - One 15 mm polyp in the sigmoid colon, removed                            with a hot snare. Resected and retrieved.                           - One 12 mm polyp in the sigmoid colon, removed                            with a cold snare. Resected and retrieved.                           - One 4 mm polyp in the descending colon, removed  with a cold snare. Resected and retrieved.                           - One 3 mm polyp at the splenic flexure, removed                             with a cold snare. Resected and retrieved.                           - One 13 mm polyp at the hepatic flexure, removed                            with a hot snare. Resected and retrieved.                           - One 1 mm polyp in the cecum, removed with a cold                            snare. Resected and retrieved.                           - The examination was otherwise normal on direct                            and retroflexion views. Recommendation:           - Patient has a contact number available for                            emergencies. The signs and symptoms of potential                            delayed complications were discussed with the                            patient. Return to normal activities tomorrow.                            Written discharge instructions were provided to the                            patient.                           - Resume previous diet.                           - Continue present medications.                           - Await pathology results.                           - Repeat colonoscopy date to be determined after  pending pathology results are reviewed for                            surveillance.                           - Review with NP Edwards: further evaluation for                            new diagnosis of atrial fibrillation and                            obstructive sleep apnea                           - Emerging evidence supports eating a diet of                            fruits, vegetables, grains, calcium, and yogurt                            while reducing red meat and alcohol may reduce the                            risk of colon cancer.                           - Thank you for allowing me to be involved in your                            colon cancer prevention. Thornton Park MD, MD 03/19/2020 11:30:47 AM This report has been signed electronically.

## 2020-03-19 NOTE — Discharge Instructions (Addendum)
You will be contacted to follow-up with the atrial fibrillation clinic or cardiology correct.  Return if problem

## 2020-03-19 NOTE — Patient Instructions (Signed)
Handout given:polyps Resume previous diet Continue current medications Await pathology results Go to hospital upon leaving [place]. YOU HAD AN ENDOSCOPIC PROCEDURE TODAY AT [facility]:   Refer to the procedure report that was given to you for any specific questions about what was found during the examination.  If the procedure report does not answer your questions, please call your gastroenterologist to clarify.  If you requested that your care partner not be given the details of your procedure findings, then the procedure report has been included in a sealed envelope for you to review at your convenience later.  YOU SHOULD EXPECT: Some feelings of bloating in the abdomen. Passage of more gas than usual.  Walking can help get rid of the air that was put into your GI tract during the procedure and reduce the bloating. If you had a lower endoscopy (such as a colonoscopy or flexible sigmoidoscopy) you may notice spotting of blood in your stool or on the toilet paper. If you underwent a bowel prep for your procedure, you may not have a normal bowel movement for a few days.  Please Note:  You might notice some irritation and congestion in your nose or some drainage.  This is from the oxygen used during your procedure.  There is no need for concern and it should clear up in a day or so.  SYMPTOMS TO REPORT IMMEDIATELY:   Following lower endoscopy (colonoscopy or flexible sigmoidoscopy):  Excessive amounts of blood in the stool  Significant tenderness or worsening of abdominal pains  Swelling of the abdomen that is new, acute  Fever of 100F or higher  For urgent or emergent issues, a gastroenterologist can be reached at any hour by calling [phone]. Do not use MyChart messaging for urgent concerns.   DIET:  We do recommend a small meal at first, but then you may proceed to your regular diet.  Drink plenty of fluids but you should avoid alcoholic beverages for 24  hours.  ACTIVITY:  You should plan to take it easy for the rest of today and you should NOT DRIVE or use heavy machinery until tomorrow (because of the sedation medicines used during the test).    FOLLOW UP: Our staff will call the number listed on your records 48-72 hours following your procedure to check on you and address any questions or concerns that you may have regarding the information given to you following your procedure. If we do not reach you, we will leave a message.  We will attempt to reach you two times.  During this call, we will ask if you have developed any symptoms of COVID 19. If you develop any symptoms (ie: fever, flu-like symptoms, shortness of breath, cough etc.) before then, please call [phone].  If you test positive for Covid 19 in the 2 weeks post procedure, please call and report this information to [place].    If any biopsies were taken you will be contacted by phone or by letter within the next 1-3 weeks.  Please call us at [phone] if you have not heard about the biopsies in 3 weeks.   SIGNATURES/CONFIDENTIALITY: You and/or your care partner have signed paperwork which will be entered into your electronic medical record.  These signatures attest to the fact that that the information above on your After Visit Summary has been reviewed and is understood.  Full responsibility of the confidentiality of this discharge information lies with you and/or your care-partner.

## 2020-03-19 NOTE — Telephone Encounter (Signed)
Discussed with Dr. Roderic Palau.

## 2020-03-19 NOTE — Progress Notes (Signed)
Pt Ht rate 104 with some PAC's upon enterting. Denies any history of irregular ht or SOB. Pt now with HR from 120-140's with more irregularity. Looks like A flutter. B/P and vital signs stable. Will recommend 12 lead EKG in recovery and cardiology consult. She also seems to have sleep apnea which she denies. Pt is alert enough to grimace and resist a jaw thrust to assist with breathing but requires assistance for optimal exchange.tb

## 2020-03-19 NOTE — Telephone Encounter (Signed)
Staff message from Gay Filler, RN at Dr. Kathalene Frames office to see pt for ED f/u this week.  Called and left message for patient to call back to schedule appt.

## 2020-03-19 NOTE — ED Provider Notes (Signed)
[place] DEPT Provider Note   CSN: [id number] Arrival date & time: 03/19/20  1216     History Chief Complaint  Patient presents with  . Irregular Heart Beat    Brittany Bauer is a 60 y.o. female.  Patient was sent over from getting a colonoscopy because she was in atrial fib.  It is unknown when she started the atrial fib.  Patient has no symptoms.  The history is provided by the patient and medical records. No language interpreter was used.  Palpitations Palpitations quality:  Irregular Onset quality:  Sudden Duration: Unknown duration. Timing:  Constant Progression:  Unchanged Chronicity:  New Context: not anxiety   Relieved by:  Nothing Worsened by:  Nothing Ineffective treatments:  None tried Associated symptoms: no back pain, no chest pain and no cough        Past Medical History:  Diagnosis Date  . Hyperlipidemia    diet controlled    Patient Active Problem List   Diagnosis Date Noted  . Class 3 severe obesity due to excess calories with body mass index (BMI) of 45.0 to 49.9 in adult [facility]) 02/13/2020    Past Surgical History:  Procedure Laterality Date  . TONSILLECTOMY    . TUBAL LIGATION    . WISDOM TOOTH EXTRACTION       OB History   No obstetric history on file.     Family History  Problem Relation Age of Onset  . Lung cancer Mother 47       smoker  . Stroke Father 23  . Cancer Father        smoker-  . Colon polyps Neg Hx   . Colon cancer Neg Hx   . Esophageal cancer Neg Hx   . Rectal cancer Neg Hx   . Stomach cancer Neg Hx     Social History   Tobacco Use  . Smoking status: Never Smoker  . Smokeless tobacco: Never Used  Vaping Use  . Vaping Use: Never used  Substance Use Topics  . Alcohol use: Yes    Alcohol/week: 5.0 standard drinks    Types: 5 Standard drinks or equivalent per week  . Drug use: Never    Home Medications Prior to Admission medications   Medication Sig Start Date End Date  Taking? Authorizing Provider  atorvastatin (LIPITOR) 20 MG tablet Take 1 tablet (20 mg total) by mouth daily. 01/20/20  Yes Kerin Perna, NP  metoprolol succinate (TOPROL-XL) 25 MG 24 hr tablet Take 1 tablet (25 mg total) by mouth daily. 03/19/20  Yes Milton Ferguson, MD  naproxen sodium (ALEVE) 220 MG tablet Take 440 mg by mouth 2 (two) times daily as needed (headache/pain).   Yes [provider]    Allergies    Penicillins  Review of Systems   Review of Systems  Constitutional: Negative for appetite change and fatigue.  HENT: Negative for congestion, ear discharge and sinus pressure.   Eyes: Negative for discharge.  Respiratory: Negative for cough.   Cardiovascular: Negative for chest pain.  Gastrointestinal: Negative for abdominal pain and diarrhea.  Genitourinary: Negative for frequency and hematuria.  Musculoskeletal: Negative for back pain.  Skin: Negative for rash.  Neurological: Negative for seizures and headaches.  Psychiatric/Behavioral: Negative for hallucinations.    Physical Exam Updated Vital Signs BP (!) 147/101   Pulse (!) 115   Temp 97.7 F (36.5 C) (Oral)   Resp 20   SpO2 94%   Physical Exam Vitals reviewed.  Constitutional:  Appearance: She is well-developed.  HENT:     Head: Normocephalic.     Nose: Nose normal.  Eyes:     General: No scleral icterus.    Extraocular Movements: EOM normal.     Conjunctiva/sclera: Conjunctivae normal.  Neck:     Thyroid: No thyromegaly.  Cardiovascular:     Rate and Rhythm: Normal rate. Rhythm irregular.     Heart sounds: No murmur heard. No friction rub. No gallop.   Pulmonary:     Breath sounds: No stridor. No wheezing or rales.  Chest:     Chest wall: No tenderness.  Abdominal:     General: There is no distension.     Tenderness: There is no abdominal tenderness. There is no rebound.  Musculoskeletal:        General: No edema. Normal range of motion.     Cervical back: Neck supple.   Lymphadenopathy:     Cervical: No cervical adenopathy.  Skin:    Findings: No erythema or rash.  Neurological:     Mental Status: She is oriented to person, place, and time.     Motor: No abnormal muscle tone.     Coordination: Coordination normal.  Psychiatric:        Mood and Affect: Mood and affect normal.        Behavior: Behavior normal.     ED Results / Procedures / Treatments   Labs (all labs ordered are listed, but only abnormal results are displayed) Labs Reviewed  BASIC METABOLIC PANEL - Abnormal; Notable for the following components:      Result Value   Calcium 8.8 (*)    All other components within normal limits  CBC - Abnormal; Notable for the following components:   RBC 5.72 (*)    Hemoglobin 15.6 (*)    HCT 49.7 (*)    All other components within normal limits    EKG EKG Interpretation  Date/Time:  Monday March 19 2020 12:25:24 EST Ventricular Rate:  116 PR Interval:    QRS Duration: 100 QT Interval:  353 QTC Calculation: 478 R Axis:   33 Text Interpretation: Atrial fibrillation Low voltage, precordial leads Abnormal R-wave progression, early transition Borderline repolarization abnormality 12 Lead; [place] Confirmed by Milton Ferguson [phone]) on 03/19/2020 1:13:57 PM   Radiology DG Chest 2 View  Result Date: 03/19/2020 CLINICAL DATA:  Elevated heart rate and onset atrial fibrillation after colonoscopy today. EXAM: CHEST - 2 VIEW COMPARISON:  None. FINDINGS: The lungs are clear. Heart size is normal. Aortic atherosclerosis. No pneumothorax or pleural fluid. No acute or focal bony abnormality. IMPRESSION: No acute disease. Aortic Atherosclerosis (ICD10-I70.0). Electronically Signed   By: Inge Rise M.D.   On: 03/19/2020 13:05    Procedures Procedures   Medications Ordered in ED Medications  metoprolol succinate (TOPROL-XL) 24 hr tablet 25 mg (has no administration in time range)    ED Course  I have reviewed the triage vital signs  and the nursing notes.  Pertinent labs & imaging results that were available during my care of the patient were reviewed by me and considered in my medical decision making (see chart for details).    MDM Rules/Calculators/A&P                          Patient with new onset atrial fib.  I spoke to cardiology and we will start her on Toprol-XL 25 mg a day and she will follow up with  the atrial fib clinic Final Clinical Impression(s) / ED Diagnoses Final diagnoses:  Paroxysmal atrial fibrillation ([place])    Rx / DC Orders ED Discharge Orders         Ordered    metoprolol succinate (TOPROL-XL) 25 MG 24 hr tablet  Daily        03/19/20 1500           Milton Ferguson, MD 03/22/20 [phone]

## 2020-03-19 NOTE — ED Triage Notes (Signed)
Patient reports just finishing a colonoscopy and MD sent her over for abnormal EKG.   Patient denies chest pain, shob, or weakness.   A/Ox4 Ambulatory triage.

## 2020-03-19 NOTE — ED Notes (Signed)
Patient transported to X-ray 

## 2020-03-19 NOTE — Telephone Encounter (Signed)
Dr. Tarri Glenn aware and will call.

## 2020-03-19 NOTE — Progress Notes (Signed)
Pt had EKG in recovery and in AFIB. Showed results to CRNA Gwenlyn Found and Dr. Tarri Glenn. Pt asymptomatic. MD requested pt to drive across street to [facility] long ED for evaluation.  Strip sent with patient.

## 2020-03-19 NOTE — Progress Notes (Signed)
Pt's states no medical or surgical changes since previsit or office visit. 

## 2020-03-19 NOTE — Telephone Encounter (Signed)
[name] from [place] ED is requesting a call back states that her physician would like to discuss this pt with Dr Tarri Glenn.  Dr. Roderic Palau CB [phone]

## 2020-03-21 ENCOUNTER — Other Ambulatory Visit: Payer: Self-pay

## 2020-03-21 ENCOUNTER — Encounter (HOSPITAL_COMMUNITY): Payer: Self-pay | Admitting: Physician Assistant

## 2020-03-21 ENCOUNTER — Ambulatory Visit (HOSPITAL_COMMUNITY)
Admission: RE | Admit: 2020-03-21 | Discharge: 2020-03-21 | Disposition: A | Discharge status: Home / Self Care | Payer: 59 | Source: Ambulatory Visit | Attending: Physician Assistant | Admitting: Physician Assistant

## 2020-03-21 ENCOUNTER — Telehealth: Payer: Self-pay | Admitting: *Deleted

## 2020-03-21 ENCOUNTER — Telehealth: Payer: Self-pay

## 2020-03-21 VITALS — BP 116/88 | HR 93 | Ht 67.0 in | Wt 300.4 lb

## 2020-03-21 DIAGNOSIS — R0683 Snoring: Secondary | ICD-10-CM | POA: Diagnosis not present

## 2020-03-21 DIAGNOSIS — R0681 Apnea, not elsewhere classified: Secondary | ICD-10-CM | POA: Insufficient documentation

## 2020-03-21 DIAGNOSIS — Z7901 Long term (current) use of anticoagulants: Secondary | ICD-10-CM | POA: Diagnosis not present

## 2020-03-21 DIAGNOSIS — Z79899 Other long term (current) drug therapy: Secondary | ICD-10-CM | POA: Diagnosis not present

## 2020-03-21 DIAGNOSIS — I4819 Other persistent atrial fibrillation: Secondary | ICD-10-CM | POA: Diagnosis not present

## 2020-03-21 DIAGNOSIS — Z8249 Family history of ischemic heart disease and other diseases of the circulatory system: Secondary | ICD-10-CM | POA: Insufficient documentation

## 2020-03-21 DIAGNOSIS — Z713 Dietary counseling and surveillance: Secondary | ICD-10-CM | POA: Diagnosis not present

## 2020-03-21 DIAGNOSIS — R400 Somnolence: Secondary | ICD-10-CM | POA: Diagnosis not present

## 2020-03-21 DIAGNOSIS — Z6841 Body Mass Index (BMI) 40.0 and over, adult: Secondary | ICD-10-CM | POA: Insufficient documentation

## 2020-03-21 DIAGNOSIS — Z87891 Personal history of nicotine dependence: Secondary | ICD-10-CM | POA: Insufficient documentation

## 2020-03-21 DIAGNOSIS — E669 Obesity, unspecified: Secondary | ICD-10-CM | POA: Diagnosis not present

## 2020-03-21 DIAGNOSIS — Z880 Allergy status to penicillin: Secondary | ICD-10-CM | POA: Diagnosis not present

## 2020-03-21 MED ORDER — APIXABAN 5 MG PO TABS: 5.0000 mg | ORAL_TABLET | Freq: Two times a day (BID) | ORAL | 3 refills | Status: DC

## 2020-03-21 NOTE — Addendum Note (Signed)
Encounter addended by: Oliver Barre, PA on: 03/21/2020 2:07 PM  Actions taken: Clinical Note Signed

## 2020-03-21 NOTE — Telephone Encounter (Signed)
  Follow up Call-  Call back number 03/19/2020  Post procedure Call Back phone  # [phone]  Permission to leave phone message Yes     No answer at 2nd attempt follow up phone call.  Left message on voicemail. No answer at 2nd attempt follow up phone call.  Left message on voicemail.

## 2020-03-21 NOTE — Progress Notes (Addendum)
Primary Care Physician: Kerin Perna, NP Primary Cardiologist: none Primary Electrophysiologist: none Referring Physician: Dr Rayne Du Ovens is a 60 y.o. female with a history of obesity and atrial fibrillation who presents for consultation in the [facility].  The patient was initially diagnosed with atrial fibrillation 03/19/20 incedentally during a routine colonoscopy. ECG in recovery showed afib with RVR and she was sent to the ED. Per report, she was in [place] when she presented for the colonoscopy. She was asymptomatic. Patient has a CHADS2VASC score of 1. She was started on metoprolol for rate control. She is in rate controlled afib today but is unaware of her arrhythmia. She denies alcohol use but does admit to snoring, witnessed apnea, and daytime somnolence.   Today, she denies symptoms of palpitations, chest pain, shortness of breath, orthopnea, PND, lower extremity edema, dizziness, presyncope, syncope, bleeding, or neurologic sequela. The patient is tolerating medications without difficulties and is otherwise without complaint today.    Atrial Fibrillation Risk Factors:  she does have symptoms or diagnosis of sleep apnea. Will refer for sleep study. she does not have a history of rheumatic fever. she does not have a history of alcohol use. The patient does not have a history of early familial atrial fibrillation or other arrhythmias.  she has a BMI of Body mass index is 47.05 kg/m.Marland Kitchen Filed Weights   03/21/20 1125  Weight: (!) 136.3 kg    Family History  Problem Relation Age of Onset  . Lung cancer Mother 58       smoker  . Stroke Father 63  . Cancer Father        smoker-  . Colon polyps Neg Hx   . Colon cancer Neg Hx   . Esophageal cancer Neg Hx   . Rectal cancer Neg Hx   . Stomach cancer Neg Hx      Atrial Fibrillation Management history:  Previous antiarrhythmic drugs: none Previous cardioversions: none Previous  ablations: none CHADS2VASC score: 1 Anticoagulation history: none   Past Medical History:  Diagnosis Date  . Hyperlipidemia    diet controlled   Past Surgical History:  Procedure Laterality Date  . TONSILLECTOMY    . TUBAL LIGATION    . WISDOM TOOTH EXTRACTION      Current Outpatient Medications  Medication Sig Dispense Refill  . apixaban (ELIQUIS) 5 MG TABS tablet Take 1 tablet (5 mg total) by mouth 2 (two) times daily. 60 tablet 3  . atorvastatin (LIPITOR) 20 MG tablet Take 1 tablet (20 mg total) by mouth daily. 90 tablet 3  . metoprolol succinate (TOPROL-XL) 25 MG 24 hr tablet Take 1 tablet (25 mg total) by mouth daily. 30 tablet 0   Current Facility-Administered Medications  Medication Dose Route Frequency Provider Last Rate Last Admin  . 0.9 %  sodium chloride infusion  500 mL Intravenous Once Thornton Park, MD        Allergies  Allergen Reactions  . Penicillins Other (See Comments)    As a child- unknown reaction    Social History   Socioeconomic History  . Marital status: Married    Spouse name: Not on file  . Number of children: Not on file  . Years of education: Not on file  . Highest education level: Not on file  Occupational History  . Not on file  Tobacco Use  . Smoking status: Former Smoker    Types: Cigarettes  . Smokeless tobacco: Never Used  Vaping Use  . Vaping Use: Never used  Substance and Sexual Activity  . Alcohol use: Yes    Alcohol/week: 5.0 standard drinks    Types: 5 Standard drinks or equivalent per week    Comment: occ  . Drug use: Never  . Sexual activity: Yes  Other Topics Concern  . Not on file  Social History Narrative  . Not on file   Social Determinants of Health   Financial Resource Strain: Not on file  Food Insecurity: Not on file  Transportation Needs: Not on file  Physical Activity: Not on file  Stress: Not on file  Social Connections: Not on file  Intimate Partner Violence: Not on file     ROS- All  systems are reviewed and negative except as per the HPI above.  Physical Exam: Vitals:   03/21/20 1125  BP: 116/88  Pulse: 93  Weight: (!) 136.3 kg  Height: 5\' 7"  (1.702 m)    GEN- The patient is a well appearing obese female, alert and oriented x 3 today.   Head- normocephalic, atraumatic Eyes-  Sclera clear, conjunctiva pink Ears- hearing intact Oropharynx- clear Neck- supple  Lungs- Clear to ausculation bilaterally, normal work of breathing Heart- irregular rate and rhythm, no murmurs, rubs or gallops  GI- soft, NT, ND, + BS Extremities- no clubbing, cyanosis, or edema MS- no significant deformity or atrophy Skin- no rash or lesion Psych- euthymic mood, full affect Neuro- strength and sensation are intact  Wt Readings from Last 3 Encounters:  03/21/20 (!) 136.3 kg  03/19/20 (!) 137.4 kg  03/05/20 (!) 137.4 kg    EKG today demonstrates  Afib  Vent. rate 93 BPM PR interval * ms QRS duration 88 ms QT/QTc 338/420 ms  Epic records are reviewed at length today  CHA2DS2-VASc Score = 1  The patient's score is based upon: CHF History: No HTN History: No Diabetes History: No Stroke History: No Vascular Disease History: No Age Score: 0 Gender Score: 1      ASSESSMENT AND PLAN: 1. Persistent Atrial Fibrillation The patient's CHA2DS2-VASc score is 1, indicating a 0.6% annual risk of stroke.   General education about afib provided and questions answered. We also discussed her stroke risk and the risks and benefits of anticoagulation. We discussed therapeutic options including DCCV. She will need 3 weeks of uninterrupted anticoagulation prior. Will reach out to her GI specialist prior to initiating as she had several polyps removed on 03/19/20. Plan to start Eliquis 5 mg BID. Continue Toprol 25 mg daily Check echocardiogram   2. Obesity Body mass index is 47.05 kg/m. Lifestyle modification was discussed at length including regular exercise and weight  reduction.  3. Snoring/witnessed apnea/daytime somnolence  Patient was also noted to have apnea during her colonoscopy. The importance of adequate treatment of sleep apnea was discussed today in order to improve our ability to maintain sinus rhythm long term. Will refer for sleep study   Follow up in the AF clinic in 3 weeks.    Addendum: D/w Dr Tarri Glenn, [place] to start anticoagulation.    [facility] [address] [place], [place] [address] [phone] 03/21/2020 12:11 PM

## 2020-03-21 NOTE — Telephone Encounter (Signed)
Attempted to reach patient for post-procedure f/u call. No answer. Left message that we will make another attempt to reach her again later today and for her to please not hesitate to call us if she has any questions/concerns regarding her care. 

## 2020-03-21 NOTE — Patient Instructions (Signed)
Start Eliquis 5mg twice a day 

## 2020-03-23 ENCOUNTER — Telehealth: Payer: Self-pay | Admitting: *Deleted

## 2020-03-23 NOTE — Telephone Encounter (Signed)
PA for sleep study submitted to [facility] via web portal.

## 2020-03-26 ENCOUNTER — Telehealth: Payer: Self-pay | Admitting: *Deleted

## 2020-03-26 NOTE — Telephone Encounter (Signed)
Left message to return a call to discuss sleep study appointment details. 

## 2020-03-29 ENCOUNTER — Encounter: Payer: Self-pay | Admitting: Gastroenterology

## 2020-03-29 NOTE — Telephone Encounter (Signed)
Patient notified of sleep study appointment details.

## 2020-04-11 ENCOUNTER — Ambulatory Visit (HOSPITAL_COMMUNITY)
Admission: RE | Admit: 2020-04-11 | Discharge: 2020-04-11 | Disposition: A | Discharge status: Home / Self Care | Payer: 59 | Source: Ambulatory Visit | Attending: Physician Assistant | Admitting: Physician Assistant

## 2020-04-11 ENCOUNTER — Encounter (HOSPITAL_COMMUNITY): Payer: Self-pay | Admitting: Physician Assistant

## 2020-04-11 ENCOUNTER — Ambulatory Visit (HOSPITAL_BASED_OUTPATIENT_CLINIC_OR_DEPARTMENT_OTHER)
Admission: RE | Admit: 2020-04-11 | Discharge: 2020-04-11 | Disposition: A | Discharge status: Home / Self Care | Payer: 59 | Source: Ambulatory Visit | Attending: Physician Assistant | Admitting: Physician Assistant

## 2020-04-11 ENCOUNTER — Other Ambulatory Visit: Payer: Self-pay

## 2020-04-11 VITALS — BP 108/74 | HR 101 | Ht 67.0 in | Wt 301.6 lb

## 2020-04-11 DIAGNOSIS — Z880 Allergy status to penicillin: Secondary | ICD-10-CM | POA: Diagnosis not present

## 2020-04-11 DIAGNOSIS — E669 Obesity, unspecified: Secondary | ICD-10-CM | POA: Diagnosis not present

## 2020-04-11 DIAGNOSIS — Z823 Family history of stroke: Secondary | ICD-10-CM | POA: Diagnosis not present

## 2020-04-11 DIAGNOSIS — Z6841 Body Mass Index (BMI) 40.0 and over, adult: Secondary | ICD-10-CM | POA: Insufficient documentation

## 2020-04-11 DIAGNOSIS — I4819 Other persistent atrial fibrillation: Secondary | ICD-10-CM

## 2020-04-11 DIAGNOSIS — Z79899 Other long term (current) drug therapy: Secondary | ICD-10-CM | POA: Diagnosis not present

## 2020-04-11 DIAGNOSIS — R0683 Snoring: Secondary | ICD-10-CM | POA: Diagnosis not present

## 2020-04-11 DIAGNOSIS — E785 Hyperlipidemia, unspecified: Secondary | ICD-10-CM | POA: Insufficient documentation

## 2020-04-11 DIAGNOSIS — Z87891 Personal history of nicotine dependence: Secondary | ICD-10-CM | POA: Diagnosis not present

## 2020-04-11 DIAGNOSIS — Z7901 Long term (current) use of anticoagulants: Secondary | ICD-10-CM | POA: Insufficient documentation

## 2020-04-11 DIAGNOSIS — I517 Cardiomegaly: Secondary | ICD-10-CM | POA: Diagnosis not present

## 2020-04-11 LAB — ECHOCARDIOGRAM COMPLETE
Height: 67 in
S' Lateral: 3.5 cm
Weight: 4825.6 oz

## 2020-04-11 MED ORDER — RIVAROXABAN 20 MG PO TABS: 20.0000 mg | ORAL_TABLET | Freq: Every day | ORAL | 3 refills | Status: DC

## 2020-04-11 MED ORDER — METOPROLOL SUCCINATE ER 25 MG PO TB24: 25.0000 mg | ORAL_TABLET | Freq: Every day | ORAL | 3 refills | Status: DC

## 2020-04-11 NOTE — Progress Notes (Signed)
  Echocardiogram 2D Echocardiogram has been performed.  Fidel Levy 04/11/2020, 3:58 PM

## 2020-04-11 NOTE — Patient Instructions (Signed)
Stop Eliquis  Start Xarelto 20mg  once a day with supper (start tonight)

## 2020-04-11 NOTE — Progress Notes (Signed)
Primary Care Physician: Kerin Perna, NP Primary Cardiologist: none Primary Electrophysiologist: none Referring Physician: Dr Rayne Du Behrens is a 60 y.o. female with a history of obesity and atrial fibrillation who presents for follow up in the [facility].  The patient was initially diagnosed with atrial fibrillation 03/19/20 incedentally during a routine colonoscopy. ECG in recovery showed afib with RVR and she was sent to the ED. Per report, she was in [place] when she presented for the colonoscopy. She was asymptomatic. Patient has a CHADS2VASC score of 1. She denies alcohol use but does admit to snoring, witnessed apnea, and daytime somnolence.   On follow up today, patient reports she has done reasonably well since her last visit. She does report feeling "listless" one hour after each dose of Eliquis. She did miss two doses of Eliquis this past weekend. She has a sleep study pending 04/26/20.   Today, she denies symptoms of palpitations, chest pain, shortness of breath, orthopnea, PND, lower extremity edema, dizziness, presyncope, syncope, bleeding, or neurologic sequela.     Atrial Fibrillation Risk Factors:  she does have symptoms or diagnosis of sleep apnea. Will refer for sleep study. she does not have a history of rheumatic fever. she does not have a history of alcohol use. The patient does not have a history of early familial atrial fibrillation or other arrhythmias.  she has a BMI of Body mass index is 47.24 kg/m.Marland Kitchen Filed Weights   04/11/20 1430  Weight: (!) 136.8 kg    Family History  Problem Relation Age of Onset  . Lung cancer Mother 46       smoker  . Stroke Father 87  . Cancer Father        smoker-  . Colon polyps Neg Hx   . Colon cancer Neg Hx   . Esophageal cancer Neg Hx   . Rectal cancer Neg Hx   . Stomach cancer Neg Hx      Atrial Fibrillation Management history:  Previous antiarrhythmic drugs: none Previous  cardioversions: none Previous ablations: none CHADS2VASC score: 1 Anticoagulation history: Eliquis   Past Medical History:  Diagnosis Date  . Hyperlipidemia    diet controlled   Past Surgical History:  Procedure Laterality Date  . TONSILLECTOMY    . TUBAL LIGATION    . WISDOM TOOTH EXTRACTION      Current Outpatient Medications  Medication Sig Dispense Refill  . apixaban (ELIQUIS) 5 MG TABS tablet Take 1 tablet (5 mg total) by mouth 2 (two) times daily. 60 tablet 3  . atorvastatin (LIPITOR) 20 MG tablet Take 1 tablet (20 mg total) by mouth daily. 90 tablet 3  . metoprolol succinate (TOPROL-XL) 25 MG 24 hr tablet Take 1 tablet (25 mg total) by mouth daily. 30 tablet 0  . naproxen sodium (ALEVE) 220 MG tablet Take 220 mg by mouth as needed (mild pain).     Current Facility-Administered Medications  Medication Dose Route Frequency Provider Last Rate Last Admin  . 0.9 %  sodium chloride infusion  500 mL Intravenous Once Thornton Park, MD        Allergies  Allergen Reactions  . Penicillins Other (See Comments)    As a child- unknown reaction    Social History   Socioeconomic History  . Marital status: Married    Spouse name: Not on file  . Number of children: Not on file  . Years of education: Not on file  . Highest education  level: Not on file  Occupational History  . Not on file  Tobacco Use  . Smoking status: Former Smoker    Types: Cigarettes  . Smokeless tobacco: Never Used  Vaping Use  . Vaping Use: Never used  Substance and Sexual Activity  . Alcohol use: Yes    Alcohol/week: 4.0 standard drinks    Types: 2 Glasses of wine, 2 Standard drinks or equivalent per week  . Drug use: Never  . Sexual activity: Yes  Other Topics Concern  . Not on file  Social History Narrative  . Not on file   Social Determinants of Health   Financial Resource Strain: Not on file  Food Insecurity: Not on file  Transportation Needs: Not on file  Physical Activity: Not  on file  Stress: Not on file  Social Connections: Not on file  Intimate Partner Violence: Not on file     ROS- All systems are reviewed and negative except as per the HPI above.  Physical Exam: Vitals:   04/11/20 1430  BP: 108/74  Pulse: (!) 101  Weight: (!) 136.8 kg  Height: 5\' 7"  (1.702 m)    GEN- The patient is a well appearing obese female, alert and oriented x 3 today.   HEENT-head normocephalic, atraumatic, sclera clear, conjunctiva pink, hearing intact, trachea midline. Lungs- Clear to ausculation bilaterally, normal work of breathing Heart- irregular rate and rhythm, no murmurs, rubs or gallops  GI- soft, NT, ND, + BS Extremities- no clubbing, cyanosis, or edema MS- no significant deformity or atrophy Skin- no rash or lesion Psych- euthymic mood, full affect Neuro- strength and sensation are intact   Wt Readings from Last 3 Encounters:  04/11/20 (!) 136.8 kg  03/21/20 (!) 136.3 kg  03/19/20 (!) 137.4 kg    EKG today demonstrates  Afib Vent. rate 101 BPM PR interval * ms QRS duration 88 ms QT/QTc 350/453 ms  Epic records are reviewed at length today  CHA2DS2-VASc Score = 1  The patient's score is based upon: CHF History: No HTN History: No Diabetes History: No Stroke History: No Vascular Disease History: No Age Score: 0 Gender Score: 1      ASSESSMENT AND PLAN: 1. Persistent Atrial Fibrillation The patient's CHA2DS2-VASc score is 1, indicating a 0.6% annual risk of stroke.   Patient remains in afib.  Unfortunately, she has missed doses of Eliquis. Will stop Eliquis due to perceived side effects and start Xarelto 20 mg daily. Will plan for DCCV after 3 weeks of uninterrupted anticoagulation. Continue Toprol 25 mg daily Echo scheduled for today.  2. Obesity Body mass index is 47.24 kg/m. Lifestyle modification was discussed at length including regular exercise and weight reduction.  3. Snoring/witnessed apnea/daytime somnolence  Sleep study  pending.   Follow up in the AF clinic in 3 weeks.    [facility] [address] [place], [place] [address] [phone] 04/11/2020 2:38 PM

## 2020-04-26 ENCOUNTER — Ambulatory Visit (HOSPITAL_BASED_OUTPATIENT_CLINIC_OR_DEPARTMENT_OTHER): Payer: 59 | Attending: Physician Assistant | Admitting: Cardiovascular Disease

## 2020-04-26 ENCOUNTER — Other Ambulatory Visit: Payer: Self-pay

## 2020-04-26 DIAGNOSIS — G4733 Obstructive sleep apnea (adult) (pediatric): Secondary | ICD-10-CM | POA: Diagnosis not present

## 2020-04-26 DIAGNOSIS — I4819 Other persistent atrial fibrillation: Secondary | ICD-10-CM

## 2020-04-26 DIAGNOSIS — I4891 Unspecified atrial fibrillation: Secondary | ICD-10-CM | POA: Diagnosis present

## 2020-05-01 NOTE — Progress Notes (Signed)
Primary Care Physician: Kerin Perna, NP Primary Cardiologist: none Primary Electrophysiologist: none Referring Physician: Dr Rayne Du Codrington is a 60 y.o. female with a history of obesity and atrial fibrillation who presents for follow up in the [facility].  The patient was initially diagnosed with atrial fibrillation 03/19/20 incedentally during a routine colonoscopy. ECG in recovery showed afib with RVR and she was sent to the ED. Per report, she was in [place] when she presented for the colonoscopy. She was asymptomatic. Patient has a CHADS2VASC score of 1. She denies alcohol use but does admit to snoring, witnessed apnea, and daytime somnolence.   On follow up today, patient reports she has done well since her last appointment. She denies any side effects since switching to Xarelto. She had a sleep study and the results are pending.   Today, she denies symptoms of palpitations, chest pain, shortness of breath, orthopnea, PND, lower extremity edema, dizziness, presyncope, syncope, bleeding, or neurologic sequela.     Atrial Fibrillation Risk Factors:  she does have symptoms or diagnosis of sleep apnea. Will refer for sleep study. she does not have a history of rheumatic fever. she does not have a history of alcohol use. The patient does not have a history of early familial atrial fibrillation or other arrhythmias.  she has a BMI of Body mass index is 47.46 kg/m.Marland Kitchen Filed Weights   05/02/20 1040  Weight: (!) 137.4 kg    Family History  Problem Relation Age of Onset  . Lung cancer Mother 64       smoker  . Stroke Father 69  . Cancer Father        smoker-  . Colon polyps Neg Hx   . Colon cancer Neg Hx   . Esophageal cancer Neg Hx   . Rectal cancer Neg Hx   . Stomach cancer Neg Hx      Atrial Fibrillation Management history:  Previous antiarrhythmic drugs: none Previous cardioversions: none Previous ablations: none CHADS2VASC  score: 1 Anticoagulation history: Eliquis, Xarelto   Past Medical History:  Diagnosis Date  . Hyperlipidemia    diet controlled   Past Surgical History:  Procedure Laterality Date  . TONSILLECTOMY    . TUBAL LIGATION    . WISDOM TOOTH EXTRACTION      Current Outpatient Medications  Medication Sig Dispense Refill  . atorvastatin (LIPITOR) 20 MG tablet Take 1 tablet (20 mg total) by mouth daily. 90 tablet 3  . metoprolol succinate (TOPROL-XL) 25 MG 24 hr tablet Take 1 tablet (25 mg total) by mouth daily. 30 tablet 3  . naproxen sodium (ALEVE) 220 MG tablet Take 220 mg by mouth as needed (mild pain).    . rivaroxaban (XARELTO) 20 MG TABS tablet Take 1 tablet (20 mg total) by mouth daily with supper. 30 tablet 3   Current Facility-Administered Medications  Medication Dose Route Frequency Provider Last Rate Last Admin  . 0.9 %  sodium chloride infusion  500 mL Intravenous Once Thornton Park, MD        Allergies  Allergen Reactions  . Penicillins Other (See Comments)    As a child- unknown reaction    Social History   Socioeconomic History  . Marital status: Married    Spouse name: Not on file  . Number of children: Not on file  . Years of education: Not on file  . Highest education level: Not on file  Occupational History  . Not on  file  Tobacco Use  . Smoking status: Former Smoker    Types: Cigarettes  . Smokeless tobacco: Never Used  Vaping Use  . Vaping Use: Never used  Substance and Sexual Activity  . Alcohol use: Yes    Alcohol/week: 4.0 standard drinks    Types: 2 Glasses of wine, 2 Standard drinks or equivalent per week  . Drug use: Never  . Sexual activity: Yes  Other Topics Concern  . Not on file  Social History Narrative  . Not on file   Social Determinants of Health   Financial Resource Strain: Not on file  Food Insecurity: Not on file  Transportation Needs: Not on file  Physical Activity: Not on file  Stress: Not on file  Social  Connections: Not on file  Intimate Partner Violence: Not on file     ROS- All systems are reviewed and negative except as per the HPI above.  Physical Exam: Vitals:   05/02/20 1040  BP: 114/76  Pulse: 86  Weight: (!) 137.4 kg  Height: 5\' 7"  (1.702 m)    GEN- The patient is a well appearing obese female, alert and oriented x 3 today.   HEENT-head normocephalic, atraumatic, sclera clear, conjunctiva pink, hearing intact, trachea midline. Lungs- Clear to ausculation bilaterally, normal work of breathing Heart- irregular rate and rhythm, no murmurs, rubs or gallops  GI- soft, NT, ND, + BS Extremities- no clubbing, cyanosis, or edema MS- no significant deformity or atrophy Skin- no rash or lesion Psych- euthymic mood, full affect Neuro- strength and sensation are intact   Wt Readings from Last 3 Encounters:  05/02/20 (!) 137.4 kg  04/26/20 136.1 kg  04/11/20 (!) 136.8 kg    EKG today demonstrates  Afib Vent. rate 86 BPM PR interval * ms QRS duration 86 ms QT/QTcB 366/437 ms  Epic records are reviewed at length today  CHA2DS2-VASc Score = 1  The patient's score is based upon: CHF History: No HTN History: No Diabetes History: No Stroke History: No Vascular Disease History: No Age Score: 0 Gender Score: 1      ASSESSMENT AND PLAN: 1. Persistent Atrial Fibrillation The patient's CHA2DS2-VASc score is 1, indicating a 0.6% annual risk of stroke.   Patient remains in rate controlled afib. Continue Xarelto 20 mg daily. Will plan for DCCV now that she has had 3 weeks of uninterrupted anticoagulation.  Continue Toprol 25 mg daily Check bmet/CBC  2. Obesity Body mass index is 47.46 kg/m. Lifestyle modification was discussed at length including regular exercise and weight reduction. Will refer to [facility] program.  3. Snoring/witnessed apnea/daytime somnolence  Sleep study results pending.   Follow up in the AF clinic post DCCV.    [facility] [address] [place], [place] [address] [phone] 05/02/2020 10:49 AM

## 2020-05-01 NOTE — H&P (View-Only) (Signed)
Primary Care Physician: Kerin Perna, NP Primary Cardiologist: none Primary Electrophysiologist: none Referring Physician: Dr Rayne Du Loescher is a 60 y.o. female with a history of obesity and atrial fibrillation who presents for follow up in the [facility].  The patient was initially diagnosed with atrial fibrillation 03/19/20 incedentally during a routine colonoscopy. ECG in recovery showed afib with RVR and she was sent to the ED. Per report, she was in [place] when she presented for the colonoscopy. She was asymptomatic. Patient has a CHADS2VASC score of 1. She denies alcohol use but does admit to snoring, witnessed apnea, and daytime somnolence.   On follow up today, patient reports she has done well since her last appointment. She denies any side effects since switching to Xarelto. She had a sleep study and the results are pending.   Today, she denies symptoms of palpitations, chest pain, shortness of breath, orthopnea, PND, lower extremity edema, dizziness, presyncope, syncope, bleeding, or neurologic sequela.     Atrial Fibrillation Risk Factors:  she does have symptoms or diagnosis of sleep apnea. Will refer for sleep study. she does not have a history of rheumatic fever. she does not have a history of alcohol use. The patient does not have a history of early familial atrial fibrillation or other arrhythmias.  she has a BMI of Body mass index is 47.46 kg/m.Marland Kitchen Filed Weights   05/02/20 1040  Weight: (!) 137.4 kg    Family History  Problem Relation Age of Onset  . Lung cancer Mother 78       smoker  . Stroke Father 42  . Cancer Father        smoker-  . Colon polyps Neg Hx   . Colon cancer Neg Hx   . Esophageal cancer Neg Hx   . Rectal cancer Neg Hx   . Stomach cancer Neg Hx      Atrial Fibrillation Management history:  Previous antiarrhythmic drugs: none Previous cardioversions: none Previous ablations: none CHADS2VASC  score: 1 Anticoagulation history: Eliquis, Xarelto   Past Medical History:  Diagnosis Date  . Hyperlipidemia    diet controlled   Past Surgical History:  Procedure Laterality Date  . TONSILLECTOMY    . TUBAL LIGATION    . WISDOM TOOTH EXTRACTION      Current Outpatient Medications  Medication Sig Dispense Refill  . atorvastatin (LIPITOR) 20 MG tablet Take 1 tablet (20 mg total) by mouth daily. 90 tablet 3  . metoprolol succinate (TOPROL-XL) 25 MG 24 hr tablet Take 1 tablet (25 mg total) by mouth daily. 30 tablet 3  . naproxen sodium (ALEVE) 220 MG tablet Take 220 mg by mouth as needed (mild pain).    . rivaroxaban (XARELTO) 20 MG TABS tablet Take 1 tablet (20 mg total) by mouth daily with supper. 30 tablet 3   Current Facility-Administered Medications  Medication Dose Route Frequency Provider Last Rate Last Admin  . 0.9 %  sodium chloride infusion  500 mL Intravenous Once [place], MD        Allergies  Allergen Reactions  . Penicillins Other (See Comments)    As a child- unknown reaction    Social History   Socioeconomic History  . Marital status: Married    Spouse name: Not on file  . Number of children: Not on file  . Years of education: Not on file  . Highest education level: Not on file  Occupational History  . Not on  file  Tobacco Use  . Smoking status: Former Smoker    Types: Cigarettes  . Smokeless tobacco: Never Used  Vaping Use  . Vaping Use: Never used  Substance and Sexual Activity  . Alcohol use: Yes    Alcohol/week: 4.0 standard drinks    Types: 2 Glasses of wine, 2 Standard drinks or equivalent per week  . Drug use: Never  . Sexual activity: Yes  Other Topics Concern  . Not on file  Social History Narrative  . Not on file   Social Determinants of Health   Financial Resource Strain: Not on file  Food Insecurity: Not on file  Transportation Needs: Not on file  Physical Activity: Not on file  Stress: Not on file  Social  Connections: Not on file  Intimate Partner Violence: Not on file     ROS- All systems are reviewed and negative except as per the HPI above.  Physical Exam: Vitals:   05/02/20 1040  BP: 114/76  Pulse: 86  Weight: (!) 137.4 kg  Height: 5\' 7"  (1.702 m)    GEN- The patient is a well appearing obese female, alert and oriented x 3 today.   HEENT-head normocephalic, atraumatic, sclera clear, conjunctiva pink, hearing intact, trachea midline. Lungs- Clear to ausculation bilaterally, normal work of breathing Heart- irregular rate and rhythm, no murmurs, rubs or gallops  GI- soft, NT, ND, + BS Extremities- no clubbing, cyanosis, or edema MS- no significant deformity or atrophy Skin- no rash or lesion Psych- euthymic mood, full affect Neuro- strength and sensation are intact   Wt Readings from Last 3 Encounters:  05/02/20 (!) 137.4 kg  04/26/20 136.1 kg  04/11/20 (!) 136.8 kg    EKG today demonstrates  Afib Vent. rate 86 BPM PR interval * ms QRS duration 86 ms QT/QTcB 366/437 ms  Epic records are reviewed at length today  CHA2DS2-VASc Score = 1  The patient's score is based upon: CHF History: No HTN History: No Diabetes History: No Stroke History: No Vascular Disease History: No Age Score: 0 Gender Score: 1      ASSESSMENT AND PLAN: 1. Persistent Atrial Fibrillation The patient's CHA2DS2-VASc score is 1, indicating a 0.6% annual risk of stroke.   Patient remains in rate controlled afib. Continue Xarelto 20 mg daily. Will plan for DCCV now that she has had 3 weeks of uninterrupted anticoagulation.  Continue Toprol 25 mg daily Check bmet/CBC  2. Obesity Body mass index is 47.46 kg/m. Lifestyle modification was discussed at length including regular exercise and weight reduction. Will refer to [facility] program.  3. Snoring/witnessed apnea/daytime somnolence  Sleep study results pending.   Follow up in the AF clinic post DCCV.    [facility] [address] [place], [place] [address] [phone] 05/02/2020 10:49 AM

## 2020-05-02 ENCOUNTER — Encounter (HOSPITAL_COMMUNITY): Payer: Self-pay | Admitting: Physician Assistant

## 2020-05-02 ENCOUNTER — Other Ambulatory Visit: Payer: Self-pay

## 2020-05-02 ENCOUNTER — Ambulatory Visit (HOSPITAL_COMMUNITY)
Admission: RE | Admit: 2020-05-02 | Discharge: 2020-05-02 | Disposition: A | Discharge status: Home / Self Care | Payer: 59 | Source: Ambulatory Visit | Attending: Physician Assistant | Admitting: Physician Assistant

## 2020-05-02 VITALS — BP 114/76 | HR 86 | Ht 67.0 in | Wt 303.0 lb

## 2020-05-02 DIAGNOSIS — Z79899 Other long term (current) drug therapy: Secondary | ICD-10-CM | POA: Diagnosis not present

## 2020-05-02 DIAGNOSIS — Z6841 Body Mass Index (BMI) 40.0 and over, adult: Secondary | ICD-10-CM | POA: Diagnosis not present

## 2020-05-02 DIAGNOSIS — R400 Somnolence: Secondary | ICD-10-CM | POA: Insufficient documentation

## 2020-05-02 DIAGNOSIS — R0681 Apnea, not elsewhere classified: Secondary | ICD-10-CM | POA: Diagnosis not present

## 2020-05-02 DIAGNOSIS — R0683 Snoring: Secondary | ICD-10-CM | POA: Diagnosis not present

## 2020-05-02 DIAGNOSIS — Z7901 Long term (current) use of anticoagulants: Secondary | ICD-10-CM | POA: Insufficient documentation

## 2020-05-02 DIAGNOSIS — E669 Obesity, unspecified: Secondary | ICD-10-CM | POA: Diagnosis not present

## 2020-05-02 DIAGNOSIS — Z87891 Personal history of nicotine dependence: Secondary | ICD-10-CM | POA: Insufficient documentation

## 2020-05-02 DIAGNOSIS — I4819 Other persistent atrial fibrillation: Secondary | ICD-10-CM | POA: Diagnosis not present

## 2020-05-02 LAB — CBC
HCT: 47.7 % — ABNORMAL HIGH (ref 36.0–46.0)
Hemoglobin: 15 g/dL (ref 12.0–15.0)
MCH: 28 pg (ref 26.0–34.0)
MCHC: 31.4 g/dL (ref 30.0–36.0)
MCV: 89 fL (ref 80.0–100.0)
Platelets: 212 10*3/uL (ref 150–400)
RBC: 5.36 MIL/uL — ABNORMAL HIGH (ref 3.87–5.11)
RDW: 14.4 % (ref 11.5–15.5)
WBC: 6 10*3/uL (ref 4.0–10.5)
nRBC: 0 % (ref 0.0–0.2)

## 2020-05-02 LAB — BASIC METABOLIC PANEL
Anion gap: 8 (ref 5–15)
BUN: 19 mg/dL (ref 8–23)
CO2: 29 mmol/L (ref 22–32)
Calcium: 9.2 mg/dL (ref 8.9–10.3)
Chloride: 104 mmol/L (ref 98–111)
Creatinine, Ser: 0.83 mg/dL (ref 0.44–1.00)
GFR, Estimated: >60 mL/min (ref >60)
Glucose, Bld: 103 mg/dL — ABNORMAL HIGH (ref 70–99)
Potassium: 4.9 mmol/L (ref 3.5–5.1)
Sodium: 141 mmol/L (ref 135–145)

## 2020-05-02 NOTE — Addendum Note (Signed)
Encounter addended by: Juluis Mire, RN on: 05/02/2020 11:50 AM  Actions taken: Order list changed, Diagnosis association updated

## 2020-05-02 NOTE — Patient Instructions (Signed)
Cardioversion scheduled for Monday, April 25th  - Arrive at the [facility] and go to admitting at 730AM  - Do not eat or drink anything after midnight the night prior to your procedure.  - Take all your morning medication (except diabetic medications) with a sip of water prior to arrival.  - You will not be able to drive home after your procedure.  - Do NOT miss any doses of your blood thinner - if you should miss a dose please notify our office immediately.  - If you feel as if you go back into normal rhythm prior to scheduled cardioversion, please notify our office immediately. If your procedure is canceled in the cardioversion suite you will be charged a cancellation fee.

## 2020-05-04 ENCOUNTER — Telehealth: Payer: Self-pay

## 2020-05-04 NOTE — Telephone Encounter (Signed)
Call to pt reference PREP referral  Explained program to pt, interested in participating Pt will check GPS to see which [place] location is closer and can do any time of day Also interested in husband participating  Asked that she give her husbands provider for referral We will be starting new classes within the next few weeks Will call her back with times/dates to start and do conduct intake appts

## 2020-05-10 ENCOUNTER — Encounter (HOSPITAL_BASED_OUTPATIENT_CLINIC_OR_DEPARTMENT_OTHER): Payer: Self-pay | Admitting: Cardiovascular Disease

## 2020-05-10 NOTE — Procedures (Signed)
Patient Name: Brittany Bauer, Brittany Bauer Date: 04/26/2020 Gender: Female D.O.B: 1960/10/19 Age (years): 60 Referring Provider: Malka So PA Height (inches): 67 Interpreting Physician: Shelva Majestic MD, ABSM Weight (lbs): 300 RPSGT: Baxter Flattery BMI: 7 MRN: [id number] Neck Size: 18.00  CLINICAL INFORMATION The patient is referred for a split night study with BPAP. MEDICATIONS atorvastatin (LIPITOR) 20 MG tablet metoprolol succinate (TOPROL-XL) 25 MG 24 hr tablet naproxen sodium (ALEVE) 220 MG tablet rivaroxaban (XARELTO) 20 MG TABS tablet  Medications self-administered by patient taken the night of the study : N/A  SLEEP STUDY TECHNIQUE As per the AASM Manual for the Scoring of Sleep and Associated Events v2.3 (April 2016) with a hypopnea requiring 4% desaturations.  The channels recorded and monitored were frontal, central and occipital EEG, electrooculogram (EOG), submentalis EMG (chin), nasal and oral airflow, thoracic and abdominal wall motion, anterior tibialis EMG, snore microphone, electrocardiogram, and pulse oximetry. Bi-level positive airway pressure (BiPAP) was initiated when the patient met split night criteria and was titrated according to treat sleep-disordered breathing.  RESPIRATORY PARAMETERS Diagnostic Total AHI (/hr): 92.6 RDI (/hr): 92.6 OA Index (/hr): 73.9 CA Index (/hr): 0.0 REM AHI (/hr): N/A NREM AHI (/hr): 92.6 Supine AHI (/hr): 85.8 Non-supine AHI (/hr): 92.93 Min O2 Sat (%): 71.0 Mean O2 (%): 88.0 Time below 88% (min): 84   Titration Optimal IPAP Pressure (cm): 20 Optimal EPAP Pressure (cm): 16 AHI at Optimal Pressure (/hr): 0 Min O2 at Optimal Pressure (%): 87.0 Sleep % at Optimal (%): 97 Supine % at Optimal (%): 100   SLEEP ARCHITECTURE The study was initiated at 10:14:22 PM and terminated at 5:10:27 AM. The total recorded time was 416.1 minutes. EEG confirmed total sleep time was 386.5 minutes yielding a sleep efficiency of 92.9%%. Sleep  onset after lights out was 8.1 minutes with a REM latency of 194.0 minutes. The patient spent 6.0%% of the night in stage N1 sleep, 71.8%% in stage N2 sleep, 0.1%% in stage N3 and 22.1% in REM. Wake after sleep onset (WASO) was 21.4 minutes. The Arousal Index was 14.7/hour.  LEG MOVEMENT DATA The total Periodic Limb Movements of Sleep (PLMS) were 0. The PLMS index was 0.0 .  CARDIAC DATA The 2 lead EKG demonstrated sinus rhythm. The mean heart rate was 100.0 beats per minute. Other EKG findings include: Atrial Fibrillation.  IMPRESSIONS - Severe obstructive sleep apnea occurred during the diagnostic portion of the study (AHI 92.6 /h with absent REM sleep).  CPAP was initiated at 5 cm, was titrated to 13 cm and due to significant events was transitioned to BiPAP at 14/10 and titrated to 20/16; AHI 0, O2 nadir 87%. - No significant central sleep apnea occurred during the diagnostic portion of the study (CAI = 0.0/hour). - Severe oxygen desaturation was noted during the diagnostic portion of the study to a nadir of 71%. - No snoring was audible during this study. - EKG findings include Atrial Fibrillation, PVC - Clinically significant periodic limb movements of sleep did not occur during the study.  DIAGNOSIS - Obstructive Sleep Apnea (G47.33)  RECOMMENDATIONS - Recommend an initial trial of BiPAP Auto therapy on with EPAP min of 16, PS of 4 and IPAP max of 25 cm H2O with heated humidification. A Medium size Fisher&Paykel Full Face Mask Simplus mask was used for the titration. - Effort should be made to optimize nasal and oropharyngeal patency. - Avoid alcohol, sedatives and other CNS depressants that may worsen sleep apnea and disrupt normal sleep architecture. -  Sleep hygiene should be reviewed to assess factors that may improve sleep quality. - Weight management and regular exercise should be initiated or continued. - Recommend a download in 30 days and sleep clinice evaluation after 4 weeks  of therapy.  [Electronically signed] 05/10/2020 08:11 PM  Shelva Majestic MD, [facility], [place], American Board of Sleep Medicine   NPI: [id number] [place] PH: [phone]   FX: [phone] [place]

## 2020-05-11 ENCOUNTER — Other Ambulatory Visit (HOSPITAL_COMMUNITY)
Admission: RE | Admit: 2020-05-11 | Discharge: 2020-05-11 | Disposition: A | Discharge status: Home / Self Care | Payer: 59 | Source: Ambulatory Visit | Attending: Cardiology | Admitting: Cardiology

## 2020-05-11 DIAGNOSIS — Z01812 Encounter for preprocedural laboratory examination: Secondary | ICD-10-CM | POA: Diagnosis present

## 2020-05-11 DIAGNOSIS — Z20822 Contact with and (suspected) exposure to covid-19: Secondary | ICD-10-CM | POA: Insufficient documentation

## 2020-05-12 LAB — SARS CORONAVIRUS 2 (TAT 6-24 HRS): SARS Coronavirus 2: NEGATIVE

## 2020-05-13 NOTE — Anesthesia Preprocedure Evaluation (Addendum)
Anesthesia Evaluation  Patient identified by MRN, date of birth, ID band Patient awake    Reviewed: Allergy & Precautions, NPO status , Patient's Chart, lab work & pertinent test results  History of Anesthesia Complications Negative for: history of anesthetic complications  Airway Mallampati: II  TM Distance: >3 FB Neck ROM: Full    Dental  (+) Upper Dentures, Lower Dentures   Pulmonary former smoker,    Pulmonary exam normal        Cardiovascular Pt. on home beta blockers Normal cardiovascular exam+ dysrhythmias (on Xarelto) Atrial Fibrillation      Neuro/Psych negative neurological ROS  negative psych ROS   GI/Hepatic negative GI ROS, Neg liver ROS,   Endo/Other  Morbid obesity (BMI 47)  Renal/GU negative Renal ROS  negative genitourinary   Musculoskeletal negative musculoskeletal ROS (+)   Abdominal   Peds  Hematology negative hematology ROS (+)   Anesthesia Other Findings Day of surgery medications reviewed with patient.  Reproductive/Obstetrics negative OB ROS                            Anesthesia Physical Anesthesia Plan  ASA: III  Anesthesia Plan: General   Post-op Pain Management:    Induction: Intravenous  PONV Risk Score and Plan: Treatment may vary due to age or medical condition and Propofol infusion  Airway Management Planned: Mask  Additional Equipment: None  Intra-op Plan:   Post-operative Plan:   Informed Consent: I have reviewed the patients History and Physical, chart, labs and discussed the procedure including the risks, benefits and alternatives for the proposed anesthesia with the patient or authorized representative who has indicated his/her understanding and acceptance.       Plan Discussed with: CRNA  Anesthesia Plan Comments:        Anesthesia Quick Evaluation

## 2020-05-14 ENCOUNTER — Ambulatory Visit (HOSPITAL_COMMUNITY)
Admission: RE | Admit: 2020-05-14 | Discharge: 2020-05-14 | Disposition: A | Discharge status: Home / Self Care | Payer: 59 | Attending: Cardiology | Admitting: Cardiology

## 2020-05-14 ENCOUNTER — Ambulatory Visit (HOSPITAL_COMMUNITY): Payer: 59 | Admitting: Anesthesiology

## 2020-05-14 ENCOUNTER — Encounter (HOSPITAL_COMMUNITY)
Admission: RE | Disposition: A | Discharge status: Home / Self Care | Payer: Self-pay | Source: Home / Self Care | Attending: Cardiology

## 2020-05-14 DIAGNOSIS — Z87891 Personal history of nicotine dependence: Secondary | ICD-10-CM | POA: Diagnosis not present

## 2020-05-14 DIAGNOSIS — R0683 Snoring: Secondary | ICD-10-CM | POA: Diagnosis not present

## 2020-05-14 DIAGNOSIS — Z7901 Long term (current) use of anticoagulants: Secondary | ICD-10-CM | POA: Diagnosis not present

## 2020-05-14 DIAGNOSIS — R0681 Apnea, not elsewhere classified: Secondary | ICD-10-CM | POA: Insufficient documentation

## 2020-05-14 DIAGNOSIS — I4819 Other persistent atrial fibrillation: Secondary | ICD-10-CM | POA: Insufficient documentation

## 2020-05-14 DIAGNOSIS — Z79899 Other long term (current) drug therapy: Secondary | ICD-10-CM | POA: Insufficient documentation

## 2020-05-14 DIAGNOSIS — Z6841 Body Mass Index (BMI) 40.0 and over, adult: Secondary | ICD-10-CM | POA: Diagnosis not present

## 2020-05-14 DIAGNOSIS — R400 Somnolence: Secondary | ICD-10-CM | POA: Insufficient documentation

## 2020-05-14 DIAGNOSIS — E669 Obesity, unspecified: Secondary | ICD-10-CM | POA: Diagnosis not present

## 2020-05-14 HISTORY — PX: CARDIOVERSION: SHX1299

## 2020-05-14 SURGERY — CARDIOVERSION
Anesthesia: General

## 2020-05-14 MED ORDER — PROPOFOL 10 MG/ML IV BOLUS
INTRAVENOUS | Status: DC | PRN
  Administered 2020-05-14: 80 mg via INTRAVENOUS

## 2020-05-14 MED ORDER — SODIUM CHLORIDE 0.9 % IV SOLN: INTRAVENOUS | Status: DC | PRN

## 2020-05-14 MED ORDER — LIDOCAINE 2% (20 MG/ML) 5 ML SYRINGE
INTRAMUSCULAR | Status: DC | PRN
  Administered 2020-05-14: 100 mg via INTRAVENOUS

## 2020-05-14 NOTE — Anesthesia Procedure Notes (Signed)
Procedure Name: General with mask airway Date/Time: 05/14/2020 8:20 AM Performed by: Amadeo Garnet, CRNA Pre-anesthesia Checklist: Patient identified, Emergency Drugs available, Suction available and Patient being monitored Patient Re-evaluated:Patient Re-evaluated prior to induction Oxygen Delivery Method: Ambu bag Preoxygenation: Pre-oxygenation with 100% oxygen Induction Type: IV induction Placement Confirmation: positive ETCO2 Dental Injury: Teeth and Oropharynx as per pre-operative assessment

## 2020-05-14 NOTE — Anesthesia Postprocedure Evaluation (Signed)
Anesthesia Post Note  Patient: Billee Balcerzak  Procedure(s) Performed: CARDIOVERSION (N/A )     Patient location during evaluation: PACU Anesthesia Type: General Level of consciousness: awake and alert and oriented Pain management: pain level controlled Vital Signs Assessment: post-procedure vital signs reviewed and stable Respiratory status: spontaneous breathing, nonlabored ventilation and respiratory function stable Cardiovascular status: blood pressure returned to baseline Postop Assessment: no apparent nausea or vomiting Anesthetic complications: no   No complications documented.  Last Vitals:  Vitals:   05/14/20 0845 05/14/20 0855  BP: (!) 122/57 106/68  Pulse: 91 91  Resp: 10 20  Temp:    SpO2: 92% 96%    Last Pain:  Vitals:   05/14/20 0855  TempSrc:   PainSc: 0-No pain                 Brennan Bailey

## 2020-05-14 NOTE — Discharge Instructions (Signed)
Electrical Cardioversion Electrical cardioversion is the delivery of a jolt of electricity to restore a normal rhythm to the heart. A rhythm that is too fast or is not regular keeps the heart from pumping well. In this procedure, sticky patches or metal paddles are placed on the chest to deliver electricity to the heart from a device. This procedure may be done in an emergency if:  There is low or no blood pressure as a result of the heart rhythm.  Normal rhythm must be restored as fast as possible to protect the brain and heart from further damage.  It may save a life. This may also be a scheduled procedure for irregular or fast heart rhythms that are not immediately life-threatening. Tell a health care provider about:  Any allergies you have.  All medicines you are taking, including vitamins, herbs, eye drops, creams, and over-the-counter medicines.  Any problems you or family members have had with anesthetic medicines.  Any blood disorders you have.  Any surgeries you have had.  Any medical conditions you have.  Whether you are pregnant or may be pregnant. What are the risks? Generally, this is a safe procedure. However, problems may occur, including:  Allergic reactions to medicines.  A blood clot that breaks free and travels to other parts of your body.  The possible return of an abnormal heart rhythm within hours or days after the procedure.  Your heart stopping (cardiac arrest). This is rare. What happens before the procedure? Medicines  Your health care provider may have you start taking: ? Blood-thinning medicines (anticoagulants) so your blood does not clot as easily. ? Medicines to help stabilize your heart rate and rhythm.  Ask your health care provider about: ? Changing or stopping your regular medicines. This is especially important if you are taking diabetes medicines or blood thinners. ? Taking medicines such as aspirin and ibuprofen. These medicines can  thin your blood. Do not take these medicines unless your health care provider tells you to take them. ? Taking over-the-counter medicines, vitamins, herbs, and supplements. General instructions  Follow instructions from your health care provider about eating or drinking restrictions.  Plan to have someone take you home from the hospital or clinic.  If you will be going home right after the procedure, plan to have someone with you for 24 hours.  Ask your health care provider what steps will be taken to help prevent infection. These may include washing your skin with a germ-killing soap. What happens during the procedure?  An IV will be inserted into one of your veins.  Sticky patches (electrodes) or metal paddles may be placed on your chest.  You will be given a medicine to help you relax (sedative).  An electrical shock will be delivered. The procedure may vary among health care providers and hospitals.   What can I expect after the procedure?  Your blood pressure, heart rate, breathing rate, and blood oxygen level will be monitored until you leave the hospital or clinic.  Your heart rhythm will be watched to make sure it does not change.  You may have some redness on the skin where the shocks were given. Follow these instructions at home:  Do not drive for 24 hours if you were given a sedative during your procedure.  Take over-the-counter and prescription medicines only as told by your health care provider.  Ask your health care provider how to check your pulse. Check it often.  Rest for 48 hours after the procedure   or as told by your health care provider.  Avoid or limit your caffeine use as told by your health care provider.  Keep all follow-up visits as told by your health care provider. This is important. Contact a health care provider if:  You feel like your heart is beating too quickly or your pulse is not regular.  You have a serious muscle cramp that does not go  away. Get help right away if:  You have discomfort in your chest.  You are dizzy or you feel faint.  You have trouble breathing or you are short of breath.  Your speech is slurred.  You have trouble moving an arm or leg on one side of your body.  Your fingers or toes turn cold or blue. Summary  Electrical cardioversion is the delivery of a jolt of electricity to restore a normal rhythm to the heart.  This procedure may be done right away in an emergency or may be a scheduled procedure if the condition is not an emergency.  Generally, this is a safe procedure.  After the procedure, check your pulse often as told by your health care provider. This information is not intended to replace advice given to you by your health care provider. Make sure you discuss any questions you have with your health care provider. Document Revised: 08/09/2018 Document Reviewed: 08/09/2018 Elsevier Patient Education  2021 Elsevier Inc.  

## 2020-05-14 NOTE — Interval H&P Note (Signed)
History and Physical Interval Note:  05/14/2020 8:07 AM  Brittany Bauer  has presented today for surgery, with the diagnosis of A-FIB.  The various methods of treatment have been discussed with the patient and family. After consideration of risks, benefits and other options for treatment, the patient has consented to  Procedure(s): CARDIOVERSION (N/A) as a surgical intervention.  The patient's history has been reviewed, patient examined, no change in status, stable for surgery.  I have reviewed the patient's chart and labs.  Questions were answered to the patient's satisfaction.     Fransico Him

## 2020-05-14 NOTE — Transfer of Care (Signed)
Immediate Anesthesia Transfer of Care Note  Patient: Brittany Bauer  Procedure(s) Performed: CARDIOVERSION (N/A )  Patient Location: Endoscopy Unit  Anesthesia Type:General  Level of Consciousness: awake, alert  and oriented  Airway & Oxygen Therapy: Patient Spontanous Breathing  Post-op Assessment: Report given to RN, Post -op Vital signs reviewed and stable and Patient moving all extremities  Post vital signs: Reviewed and stable  Last Vitals:  Vitals Value Taken Time  BP 122/57 05/14/20 0842  Temp 36.7 C 05/14/20 0836  Pulse 91 05/14/20 0844  Resp 10 05/14/20 0844  SpO2 88 % 05/14/20 0844  Vitals shown include unvalidated device data.  Last Pain:  Vitals:   05/14/20 0836  TempSrc: Oral  PainSc: 0-No pain         Complications: No complications documented.

## 2020-05-14 NOTE — CV Procedure (Signed)
   Electrical Cardioversion Procedure Note Brittany Bauer [id number] 1960/04/25  Procedure: Electrical Cardioversion Indications:  Atrial Fibrillation  Time Out: Verified patient identification, verified procedure,medications/allergies/relevent history reviewed, required imaging and test results available.  Performed  Procedure Details  The patient was NPO after midnight. Anesthesia was administered at the beside  by Dr.Howze with 80mg  of propofol and Lidocaine 100mg .  Cardioversion was done with synchronized biphasic defibrillation with AP pads with 150watts.  The patient failed to convert to normal sinus rhythm. Cardioversion was repeated with synchronized biphasic defibrillation with AP pads with 200watts.  The patient failed to convert to normal sinus rhythm.  Cardioversion was repeated with synchronized biphasic defibrillation with AP pads with 200watts and add pressure on pads.  The patient failed to convert to normal sinus rhythm.  The patient tolerated the procedure well   IMPRESSION:  Unsuccessful cardioversion of atrial fibrillation  Will refer back to afib clinic for further consideration of antiarrhythmic therapy    Detravion Tester 05/14/2020, 8:08 AM

## 2020-05-15 ENCOUNTER — Encounter (HOSPITAL_COMMUNITY): Payer: Self-pay | Admitting: Cardiology

## 2020-05-15 ENCOUNTER — Telehealth: Payer: Self-pay | Admitting: *Deleted

## 2020-05-15 NOTE — Telephone Encounter (Signed)
Patient informed of slep study results and recommendations. She agrees to proceed with the initiation of BIPAP therapy. Orders sent to [facility].

## 2020-05-21 ENCOUNTER — Other Ambulatory Visit: Payer: Self-pay

## 2020-05-21 ENCOUNTER — Encounter (HOSPITAL_COMMUNITY): Payer: Self-pay | Admitting: Physician Assistant

## 2020-05-21 ENCOUNTER — Ambulatory Visit (HOSPITAL_COMMUNITY)
Admission: RE | Admit: 2020-05-21 | Discharge: 2020-05-21 | Disposition: A | Discharge status: Home / Self Care | Payer: 59 | Source: Ambulatory Visit | Attending: Physician Assistant | Admitting: Physician Assistant

## 2020-05-21 VITALS — BP 108/60 | HR 73 | Ht 67.0 in | Wt 303.0 lb

## 2020-05-21 DIAGNOSIS — Z79899 Other long term (current) drug therapy: Secondary | ICD-10-CM | POA: Diagnosis not present

## 2020-05-21 DIAGNOSIS — Z87891 Personal history of nicotine dependence: Secondary | ICD-10-CM | POA: Diagnosis not present

## 2020-05-21 DIAGNOSIS — G4733 Obstructive sleep apnea (adult) (pediatric): Secondary | ICD-10-CM | POA: Diagnosis not present

## 2020-05-21 DIAGNOSIS — I4819 Other persistent atrial fibrillation: Secondary | ICD-10-CM | POA: Diagnosis not present

## 2020-05-21 DIAGNOSIS — Z7901 Long term (current) use of anticoagulants: Secondary | ICD-10-CM | POA: Insufficient documentation

## 2020-05-21 DIAGNOSIS — E669 Obesity, unspecified: Secondary | ICD-10-CM | POA: Insufficient documentation

## 2020-05-21 DIAGNOSIS — Z6841 Body Mass Index (BMI) 40.0 and over, adult: Secondary | ICD-10-CM | POA: Diagnosis not present

## 2020-05-21 LAB — BASIC METABOLIC PANEL
Anion gap: 6 (ref 5–15)
BUN: 15 mg/dL (ref 8–23)
CO2: 28 mmol/L (ref 22–32)
Calcium: 8.9 mg/dL (ref 8.9–10.3)
Chloride: 105 mmol/L (ref 98–111)
Creatinine, Ser: 0.77 mg/dL (ref 0.44–1.00)
GFR, Estimated: >60 mL/min (ref >60)
Glucose, Bld: 81 mg/dL (ref 70–99)
Potassium: 5 mmol/L (ref 3.5–5.1)
Sodium: 139 mmol/L (ref 135–145)

## 2020-05-21 LAB — CBC
HCT: 50.1 % — ABNORMAL HIGH (ref 36.0–46.0)
Hemoglobin: 16 g/dL — ABNORMAL HIGH (ref 12.0–15.0)
MCH: 27.8 pg (ref 26.0–34.0)
MCHC: 31.9 g/dL (ref 30.0–36.0)
MCV: 87.1 fL (ref 80.0–100.0)
Platelets: 190 10*3/uL (ref 150–400)
RBC: 5.75 MIL/uL — ABNORMAL HIGH (ref 3.87–5.11)
RDW: 14.2 % (ref 11.5–15.5)
WBC: 6.5 10*3/uL (ref 4.0–10.5)
nRBC: 0 % (ref 0.0–0.2)

## 2020-05-21 MED ORDER — FLECAINIDE ACETATE 100 MG PO TABS: 100.0000 mg | ORAL_TABLET | Freq: Two times a day (BID) | ORAL | 3 refills | Status: DC

## 2020-05-21 NOTE — Patient Instructions (Signed)
Cardioversion scheduled for Tuesday, May 10th  - Arrive at the [facility] and go to admitting at 830AM  - Do not eat or drink anything after midnight the night prior to your procedure.  - Take all your morning medication (except diabetic medications) with a sip of water prior to arrival.  - You will not be able to drive home after your procedure.  - Do NOT miss any doses of your blood thinner - if you should miss a dose please notify our office immediately.  - If you feel as if you go back into normal rhythm prior to scheduled cardioversion, please notify our office immediately. If your procedure is canceled in the cardioversion suite you will be charged a cancellation fee.   On Friday start Flecainide 100mg  twice a day

## 2020-05-21 NOTE — Progress Notes (Signed)
Primary Care Physician: Kerin Perna, NP Primary Cardiologist: none Primary Electrophysiologist: none Referring Physician: Dr Rayne Du Brittany is a 60 y.o. female with a history of obesity, OSA, and atrial fibrillation who presents for follow up in the [facility].  The patient was initially diagnosed with atrial fibrillation 03/19/20 incedentally during a routine colonoscopy. ECG in recovery showed afib with RVR and she was sent to the ED. Per report, she was in [place] when she presented for the colonoscopy. She was asymptomatic. Patient has a CHADS2VASC score of 1. She denies alcohol use. She has been diagnosed with severe OSA.   On follow up today, patient is s/p DCCV which was unsuccessful at restoring SR. She remains rate controlled and unaware of her arrhythmia. She denies any bleeding issues on anticoagulation. Echo showed EF 50-55%, mild biatrial enlargement.  Today, she denies symptoms of palpitations, chest pain, shortness of breath, orthopnea, PND, lower extremity edema, dizziness, presyncope, syncope, bleeding, Bauer neurologic sequela.     Atrial Fibrillation Risk Factors:  she does have symptoms Bauer diagnosis of sleep apnea. Patient waiting on BiPap. she does not have a history of rheumatic fever. she does not have a history of alcohol use. The patient does not have a history of early familial atrial fibrillation Bauer other arrhythmias.  she has a BMI of Body mass index is 47.46 kg/m.Marland Kitchen Filed Weights   05/21/20 1329  Weight: (!) 137.4 kg    Family History  Problem Relation Age of Onset  . Lung cancer Mother 2       smoker  . Stroke Father 34  . Cancer Father        smoker-  . Colon polyps Neg Hx   . Colon cancer Neg Hx   . Esophageal cancer Neg Hx   . Rectal cancer Neg Hx   . Stomach cancer Neg Hx      Atrial Fibrillation Management history:  Previous antiarrhythmic drugs: none Previous cardioversions: 04/11/20 Previous  ablations: none CHADS2VASC score: 1 Anticoagulation history: Eliquis, Xarelto   Past Medical History:  Diagnosis Date  . Hyperlipidemia    diet controlled   Past Surgical History:  Procedure Laterality Date  . CARDIOVERSION N/A 05/14/2020   Procedure: CARDIOVERSION;  Surgeon: Sueanne Margarita, MD;  Location: [facility] ENDOSCOPY;  Service: Cardiovascular;  Laterality: N/A;  . TONSILLECTOMY    . TUBAL LIGATION    . WISDOM TOOTH EXTRACTION      Current Outpatient Medications  Medication Sig Dispense Refill  . atorvastatin (LIPITOR) 20 MG tablet Take 1 tablet (20 mg total) by mouth daily. (Patient taking differently: Take 20 mg by mouth at bedtime.) 90 tablet 3  . metoprolol succinate (TOPROL-XL) 25 MG 24 hr tablet Take 1 tablet (25 mg total) by mouth daily. (Patient taking differently: Take 25 mg by mouth at bedtime.) 30 tablet 3  . rivaroxaban (XARELTO) 20 MG TABS tablet Take 1 tablet (20 mg total) by mouth daily with supper. (Patient taking differently: Take 20 mg by mouth at bedtime.) 30 tablet 3  . naproxen sodium (ALEVE) 220 MG tablet Take 220 mg by mouth as needed (mild pain).     No current facility-administered medications for this encounter.    Allergies  Allergen Reactions  . Penicillins Other (See Comments)    As a child- unknown reaction    Social History   Socioeconomic History  . Marital status: Married    Spouse name: Not on file  .  Number of children: Not on file  . Years of education: Not on file  . Highest education level: Not on file  Occupational History  . Not on file  Tobacco Use  . Smoking status: Former Smoker    Types: Cigarettes  . Smokeless tobacco: Never Used  Vaping Use  . Vaping Use: Never used  Substance and Sexual Activity  . Alcohol use: Yes    Alcohol/week: 4.0 standard drinks    Types: 2 Glasses of wine, 2 Standard drinks Bauer equivalent per week  . Drug use: Never  . Sexual activity: Yes  Other Topics Concern  . Not on file  Social  History Narrative  . Not on file   Social Determinants of Health   Financial Resource Strain: Not on file  Food Insecurity: Not on file  Transportation Needs: Not on file  Physical Activity: Not on file  Stress: Not on file  Social Connections: Not on file  Intimate Partner Violence: Not on file     ROS- All systems are reviewed and negative except as per the HPI above.  Physical Exam: Vitals:   05/21/20 1329  BP: 108/60  Pulse: 73  Weight: (!) 137.4 kg  Height: 5\' 7"  (1.702 m)    GEN- The patient is a well appearing obese female, alert and oriented x 3 today.   HEENT-head normocephalic, atraumatic, sclera clear, conjunctiva pink, hearing intact, trachea midline. Lungs- Clear to ausculation bilaterally, normal work of breathing Heart- irregular rate and rhythm, no murmurs, rubs Bauer gallops  GI- soft, NT, ND, + BS Extremities- no clubbing, cyanosis, Bauer edema MS- no significant deformity Bauer atrophy Skin- no rash Bauer lesion Psych- euthymic mood, full affect Neuro- strength and sensation are intact   Wt Readings from Last 3 Encounters:  05/21/20 (!) 137.4 kg  05/14/20 135.6 kg  05/02/20 (!) 137.4 kg    EKG today demonstrates  Afib Vent. rate 73 BPM PR interval * ms QRS duration 84 ms QT/QTcB 376/414 ms  Echo 04/11/20 demonstrated 1. Left ventricular ejection fraction, by estimation, is 50 to 55%. The  left ventricle has low normal function. The left ventricle has no regional  wall motion abnormalities. Left ventricular diastolic function could not  be evaluated.  2. Right ventricular systolic function is mildly reduced. The right  ventricular size is mildly enlarged.  3. Left atrial size was mildly dilated.  4. Right atrial size was mildly dilated.  5. The mitral valve is normal in structure. No evidence of mitral valve  regurgitation.  6. The aortic valve is grossly normal. Aortic valve regurgitation is not  visualized.   Epic records are reviewed at  length today  CHA2DS2-VASc Score = 1  The patient's score is based upon: CHF History: No HTN History: No Diabetes History: No Stroke History: No Vascular Disease History: No Age Score: 0 Gender Score: 1      ASSESSMENT AND PLAN: 1. Persistent Atrial Fibrillation The patient's CHA2DS2-VASc score is 1, indicating a 0.6% annual risk of stroke.   S/p unsuccessful DCCV on 05/14/20. We discussed therapeutic options today including AAD (flecainide, dofetilide) vs rate control.  Will start flecainide 100 mg BID and plan for repeat DCCV. Check bmet/cbc. Also stressed importance of lifestyle modification including physical activity, weight loss, and compliance with OSA treatment.  Continue Xarelto 20 mg daily Continue Toprol 25 mg daily  2. Obesity Body mass index is 47.46 kg/m. Lifestyle modification was discussed and encouraged including regular physical activity and weight reduction. Patient  scheduled to start [employer] 12 week training course.  3. Severe OSA The importance of adequate treatment of sleep apnea was discussed today in order to improve our ability to maintain sinus rhythm long term. Patient has used her husband's CPAP a few times and reports it worked well. She is waiting on her BiPap to arrive.    Follow up in the AF clinic post DCCV.    [facility] [address] [place], [place] [address] [phone] 05/21/2020 1:39 PM

## 2020-05-28 ENCOUNTER — Other Ambulatory Visit: Payer: Self-pay

## 2020-05-28 ENCOUNTER — Other Ambulatory Visit (HOSPITAL_COMMUNITY)
Admission: RE | Admit: 2020-05-28 | Discharge: 2020-05-28 | Disposition: A | Discharge status: Home / Self Care | Payer: 59 | Source: Ambulatory Visit

## 2020-05-28 ENCOUNTER — Ambulatory Visit (HOSPITAL_COMMUNITY)
Admission: RE | Admit: 2020-05-28 | Discharge: 2020-05-28 | Disposition: A | Discharge status: Home / Self Care | Payer: 59 | Source: Ambulatory Visit | Attending: Physician Assistant | Admitting: Physician Assistant

## 2020-05-28 DIAGNOSIS — Z01812 Encounter for preprocedural laboratory examination: Secondary | ICD-10-CM | POA: Insufficient documentation

## 2020-05-28 DIAGNOSIS — Z20822 Contact with and (suspected) exposure to covid-19: Secondary | ICD-10-CM | POA: Insufficient documentation

## 2020-05-28 DIAGNOSIS — I4891 Unspecified atrial fibrillation: Secondary | ICD-10-CM | POA: Insufficient documentation

## 2020-05-28 DIAGNOSIS — I4819 Other persistent atrial fibrillation: Secondary | ICD-10-CM

## 2020-05-28 LAB — SARS CORONAVIRUS 2 (TAT 6-24 HRS): SARS Coronavirus 2: NEGATIVE

## 2020-05-28 NOTE — Progress Notes (Signed)
Patient returns for ECG pre DCCV. She was unsure if she was back in SR. ECG shows rate controlled afib HR 85, QRS 96, QTc 447. Plan for DCCV as scheduled.

## 2020-05-28 NOTE — H&P (View-Only) (Signed)
Patient returns for ECG pre DCCV. She was unsure if she was back in SR. ECG shows rate controlled afib HR 85, QRS 96, QTc 447. Plan for DCCV as scheduled.  

## 2020-05-29 ENCOUNTER — Ambulatory Visit (HOSPITAL_COMMUNITY): Payer: 59 | Admitting: Anesthesiology

## 2020-05-29 ENCOUNTER — Encounter (HOSPITAL_COMMUNITY): Payer: Self-pay | Admitting: Cardiovascular Disease

## 2020-05-29 ENCOUNTER — Other Ambulatory Visit: Payer: Self-pay

## 2020-05-29 ENCOUNTER — Ambulatory Visit (HOSPITAL_COMMUNITY)
Admission: RE | Admit: 2020-05-29 | Discharge: 2020-05-29 | Disposition: A | Discharge status: Home / Self Care | Payer: 59 | Attending: Cardiovascular Disease | Admitting: Cardiovascular Disease

## 2020-05-29 ENCOUNTER — Encounter (HOSPITAL_COMMUNITY)
Admission: RE | Disposition: A | Discharge status: Home / Self Care | Payer: Self-pay | Source: Home / Self Care | Attending: Cardiovascular Disease

## 2020-05-29 DIAGNOSIS — I4819 Other persistent atrial fibrillation: Secondary | ICD-10-CM

## 2020-05-29 DIAGNOSIS — I4891 Unspecified atrial fibrillation: Secondary | ICD-10-CM | POA: Diagnosis present

## 2020-05-29 HISTORY — PX: CARDIOVERSION: SHX1299

## 2020-05-29 SURGERY — CARDIOVERSION
Anesthesia: General

## 2020-05-29 MED ORDER — SODIUM CHLORIDE 0.9 % IV SOLN: INTRAVENOUS | Status: DC

## 2020-05-29 MED ORDER — PROPOFOL 10 MG/ML IV BOLUS
INTRAVENOUS | Status: DC | PRN
  Administered 2020-05-29: 70 mg via INTRAVENOUS
  Administered 2020-05-29: 20 mg via INTRAVENOUS

## 2020-05-29 MED ORDER — LIDOCAINE 2% (20 MG/ML) 5 ML SYRINGE
INTRAMUSCULAR | Status: DC | PRN
  Administered 2020-05-29: 100 mg via INTRAVENOUS

## 2020-05-29 NOTE — Transfer of Care (Signed)
Immediate Anesthesia Transfer of Care Note  Patient: Brittany Bauer  Procedure(s) Performed: CARDIOVERSION (N/A )  Patient Location: Endoscopy Unit  Anesthesia Type:General  Level of Consciousness: awake, alert  and oriented  Airway & Oxygen Therapy: Patient Spontanous Breathing and Patient connected to nasal cannula oxygen  Post-op Assessment: Report given to RN and Post -op Vital signs reviewed and stable  Post vital signs: Reviewed and stable  Last Vitals:  Vitals Value Taken Time  BP    Temp    Pulse    Resp    SpO2      Last Pain:  Vitals:   05/29/20 0832  TempSrc: Tympanic  PainSc: 0-No pain         Complications: No complications documented.

## 2020-05-29 NOTE — Anesthesia Postprocedure Evaluation (Signed)
Anesthesia Post Note  Patient: Brittany Bauer  Procedure(s) Performed: CARDIOVERSION (N/A )     Patient location during evaluation: Endoscopy Anesthesia Type: General Level of consciousness: awake and alert Pain management: pain level controlled Vital Signs Assessment: post-procedure vital signs reviewed and stable Respiratory status: spontaneous breathing, nonlabored ventilation, respiratory function stable and patient connected to nasal cannula oxygen Cardiovascular status: blood pressure returned to baseline and stable Postop Assessment: no apparent nausea or vomiting Anesthetic complications: no   No complications documented.  Last Vitals:  Vitals:   05/29/20 0920 05/29/20 0930  BP: (!) 89/61 (!) 96/51  Pulse: (!) 51 (!) 54  Resp: 15 18  Temp:    SpO2: 92% 93%    Last Pain:  Vitals:   05/29/20 0930  TempSrc:   PainSc: 0-No pain                 [name]

## 2020-05-29 NOTE — Anesthesia Procedure Notes (Signed)
Procedure Name: General with mask airway Date/Time: 05/29/2020 9:07 AM Performed by: Imagene Riches, CRNA Pre-anesthesia Checklist: Patient identified, Emergency Drugs available, Suction available, Patient being monitored and Timeout performed Patient Re-evaluated:Patient Re-evaluated prior to induction Oxygen Delivery Method: Nasal cannula

## 2020-05-29 NOTE — Discharge Instructions (Signed)
Electrical Cardioversion Electrical cardioversion is the delivery of a jolt of electricity to restore a normal rhythm to the heart. A rhythm that is too fast or is not regular keeps the heart from pumping well. In this procedure, sticky patches or metal paddles are placed on the chest to deliver electricity to the heart from a device. This procedure may be done in an emergency if:  There is low or no blood pressure as a result of the heart rhythm.  Normal rhythm must be restored as fast as possible to protect the brain and heart from further damage.  It may save a life. This may also be a scheduled procedure for irregular or fast heart rhythms that are not immediately life-threatening. Tell a health care provider about:  Any allergies you have.  All medicines you are taking, including vitamins, herbs, eye drops, creams, and over-the-counter medicines.  Any problems you or family members have had with anesthetic medicines.  Any blood disorders you have.  Any surgeries you have had.  Any medical conditions you have.  Whether you are pregnant or may be pregnant. What are the risks? Generally, this is a safe procedure. However, problems may occur, including:  Allergic reactions to medicines.  A blood clot that breaks free and travels to other parts of your body.  The possible return of an abnormal heart rhythm within hours or days after the procedure.  Your heart stopping (cardiac arrest). This is rare. What happens before the procedure? Medicines  Your health care provider may have you start taking: ? Blood-thinning medicines (anticoagulants) so your blood does not clot as easily. ? Medicines to help stabilize your heart rate and rhythm.  Ask your health care provider about: ? Changing or stopping your regular medicines. This is especially important if you are taking diabetes medicines or blood thinners. ? Taking medicines such as aspirin and ibuprofen. These medicines can  thin your blood. Do not take these medicines unless your health care provider tells you to take them. ? Taking over-the-counter medicines, vitamins, herbs, and supplements. General instructions  Follow instructions from your health care provider about eating or drinking restrictions.  Plan to have someone take you home from the hospital or clinic.  If you will be going home right after the procedure, plan to have someone with you for 24 hours.  Ask your health care provider what steps will be taken to help prevent infection. These may include washing your skin with a germ-killing soap. What happens during the procedure?  An IV will be inserted into one of your veins.  Sticky patches (electrodes) or metal paddles may be placed on your chest.  You will be given a medicine to help you relax (sedative).  An electrical shock will be delivered. The procedure may vary among health care providers and hospitals.   What can I expect after the procedure?  Your blood pressure, heart rate, breathing rate, and blood oxygen level will be monitored until you leave the hospital or clinic.  Your heart rhythm will be watched to make sure it does not change.  You may have some redness on the skin where the shocks were given. Follow these instructions at home:  Do not drive for 24 hours if you were given a sedative during your procedure.  Take over-the-counter and prescription medicines only as told by your health care provider.  Ask your health care provider how to check your pulse. Check it often.  Rest for 48 hours after the procedure   or as told by your health care provider.  Avoid or limit your caffeine use as told by your health care provider.  Keep all follow-up visits as told by your health care provider. This is important. Contact a health care provider if:  You feel like your heart is beating too quickly or your pulse is not regular.  You have a serious muscle cramp that does not go  away. Get help right away if:  You have discomfort in your chest.  You are dizzy or you feel faint.  You have trouble breathing or you are short of breath.  Your speech is slurred.  You have trouble moving an arm or leg on one side of your body.  Your fingers or toes turn cold or blue. Summary  Electrical cardioversion is the delivery of a jolt of electricity to restore a normal rhythm to the heart.  This procedure may be done right away in an emergency or may be a scheduled procedure if the condition is not an emergency.  Generally, this is a safe procedure.  After the procedure, check your pulse often as told by your health care provider. This information is not intended to replace advice given to you by your health care provider. Make sure you discuss any questions you have with your health care provider. Document Revised: 08/09/2018 Document Reviewed: 08/09/2018 Elsevier Patient Education  2021 Elsevier Inc.  

## 2020-05-29 NOTE — Anesthesia Preprocedure Evaluation (Addendum)
Anesthesia Evaluation  Patient identified by MRN, date of birth, ID band Patient awake    Reviewed: Allergy & Precautions, NPO status , Patient's Chart, lab work & pertinent test results, reviewed documented beta blocker date and time   Airway Mallampati: III  TM Distance: >3 FB Neck ROM: Full    Dental  (+) Edentulous Upper, Edentulous Lower, Upper Dentures, Lower Dentures   Pulmonary neg pulmonary ROS, former smoker,    Pulmonary exam normal breath sounds clear to auscultation       Cardiovascular Normal cardiovascular exam+ dysrhythmias (on xarelto, flecainide) Atrial Fibrillation  Rhythm:Irregular Rate:Normal  HLD  TTE 2022 1. Left ventricular ejection fraction, by estimation, is 50 to 55%. The  left ventricle has low normal function. The left ventricle has no regional  wall motion abnormalities. Left ventricular diastolic function could not  be evaluated.  2. Right ventricular systolic function is mildly reduced. The right  ventricular size is mildly enlarged.  3. Left atrial size was mildly dilated.  4. Right atrial size was mildly dilated.  5. The mitral valve is normal in structure. No evidence of mitral valve  regurgitation.  6. The aortic valve is grossly normal. Aortic valve regurgitation is not  visualized.    Neuro/Psych negative neurological ROS  negative psych ROS   GI/Hepatic negative GI ROS, Neg liver ROS,   Endo/Other  Morbid obesity (BMI 47)  Renal/GU negative Renal ROS  negative genitourinary   Musculoskeletal negative musculoskeletal ROS (+)   Abdominal   Peds  Hematology negative hematology ROS (+)   Anesthesia Other Findings   Reproductive/Obstetrics                            Anesthesia Physical Anesthesia Plan  ASA: III  Anesthesia Plan: General   Post-op Pain Management:    Induction: Intravenous  PONV Risk Score and Plan: 3 and Propofol  infusion and Treatment may vary due to age or medical condition  Airway Management Planned: Natural Airway and Mask  Additional Equipment:   Intra-op Plan:   Post-operative Plan:   Informed Consent: I have reviewed the patients History and Physical, chart, labs and discussed the procedure including the risks, benefits and alternatives for the proposed anesthesia with the patient or authorized representative who has indicated his/her understanding and acceptance.     Dental advisory given  Plan Discussed with: CRNA  Anesthesia Plan Comments:         Anesthesia Quick Evaluation

## 2020-05-29 NOTE — Interval H&P Note (Signed)
History and Physical Interval Note:  05/29/2020 8:21 AM  Brittany Bauer  has presented today for surgery, with the diagnosis of A-FIB.  The various methods of treatment have been discussed with the patient and family. After consideration of risks, benefits and other options for treatment, the patient has consented to  Procedure(s): CARDIOVERSION (N/A) as a surgical intervention.  The patient's history has been reviewed, patient examined, no change in status, stable for surgery.  I have reviewed the patient's chart and labs.  Questions were answered to the patient's satisfaction.     [name], MD

## 2020-05-29 NOTE — CV Procedure (Signed)
Electrical Cardioversion Procedure Note Brittany Bauer [id number] 11-Mar-1960  Procedure: Electrical Cardioversion Indications:  Atrial Fibrillation  Procedure Details Consent: Risks of procedure as well as the alternatives and risks of each were explained to the (patient/caregiver).  Consent for procedure obtained. Time Out: Verified patient identification, verified procedure, site/side was marked, verified correct patient position, special equipment/implants available, medications/allergies/relevent history reviewed, required imaging and test results available.  Performed  Patient placed on cardiac monitor, pulse oximetry, supplemental oxygen as necessary.  Sedation given: propofol Pacer pads placed anterior and posterior chest.  Cardioverted 3 time(s).  Cardioverted at 200J unsuccessful, 200J with pressure unsuccessful, 200J with pressure successful.  Evaluation Findings: Post procedure EKG shows: sinus bradycardia Complications: None] Patient did tolerate procedure well.   [name], MD 05/29/2020, 9:12 AM

## 2020-06-05 ENCOUNTER — Ambulatory Visit (HOSPITAL_COMMUNITY)
Admission: RE | Admit: 2020-06-05 | Discharge: 2020-06-05 | Disposition: A | Discharge status: Home / Self Care | Payer: 59 | Source: Ambulatory Visit | Attending: Physician Assistant | Admitting: Physician Assistant

## 2020-06-05 ENCOUNTER — Other Ambulatory Visit: Payer: Self-pay

## 2020-06-05 ENCOUNTER — Encounter (HOSPITAL_COMMUNITY): Payer: Self-pay | Admitting: Physician Assistant

## 2020-06-05 VITALS — BP 116/76 | HR 81 | Ht 67.0 in | Wt 305.4 lb

## 2020-06-05 DIAGNOSIS — I483 Typical atrial flutter: Secondary | ICD-10-CM | POA: Insufficient documentation

## 2020-06-05 DIAGNOSIS — Z6841 Body Mass Index (BMI) 40.0 and over, adult: Secondary | ICD-10-CM | POA: Diagnosis not present

## 2020-06-05 DIAGNOSIS — G4733 Obstructive sleep apnea (adult) (pediatric): Secondary | ICD-10-CM | POA: Insufficient documentation

## 2020-06-05 DIAGNOSIS — Z7901 Long term (current) use of anticoagulants: Secondary | ICD-10-CM | POA: Insufficient documentation

## 2020-06-05 DIAGNOSIS — E669 Obesity, unspecified: Secondary | ICD-10-CM | POA: Insufficient documentation

## 2020-06-05 DIAGNOSIS — I4892 Unspecified atrial flutter: Secondary | ICD-10-CM | POA: Diagnosis not present

## 2020-06-05 DIAGNOSIS — Z87891 Personal history of nicotine dependence: Secondary | ICD-10-CM | POA: Insufficient documentation

## 2020-06-05 DIAGNOSIS — I4819 Other persistent atrial fibrillation: Secondary | ICD-10-CM | POA: Insufficient documentation

## 2020-06-05 DIAGNOSIS — Z79899 Other long term (current) drug therapy: Secondary | ICD-10-CM | POA: Insufficient documentation

## 2020-06-05 NOTE — Patient Instructions (Signed)
Stop flecainide 

## 2020-06-05 NOTE — H&P (View-Only) (Signed)
Primary Care Physician: Kerin Perna, NP Primary Cardiologist: none Primary Electrophysiologist: none Referring Physician: Dr Rayne Du Buege is a 60 y.o. female with a history of obesity, OSA, and atrial fibrillation who presents for follow up in the [facility].  The patient was initially diagnosed with atrial fibrillation 03/19/20 incedentally during a routine colonoscopy. ECG in recovery showed afib with RVR and she was sent to the ED. Per report, she was in [place] when she presented for the colonoscopy. She was asymptomatic. Patient has a CHADS2VASC score of 1. She denies alcohol use. She has been diagnosed with severe OSA. Patient is s/p DCCV 05/14/20 which was unsuccessful at restoring SR. She was loaded on flecainide.   On follow up today, patient is s/p repeat DCCV on 05/29/20. She required 3 shocks to convert. She is in atrial flutter today. She denies feeling any better in SR. She does have some dizziness and tiredness since starting flecainide. She denies any bleeding issues on anticoagulation.   Today, she denies symptoms of palpitations, chest pain, shortness of breath, orthopnea, PND, lower extremity edema, presyncope, syncope, bleeding, or neurologic sequela.     Atrial Fibrillation Risk Factors:  she does have symptoms or diagnosis of sleep apnea. Patient waiting on BiPap.  she does not have a history of rheumatic fever. she does not have a history of alcohol use. The patient does not have a history of early familial atrial fibrillation or other arrhythmias.  she has a BMI of Body mass index is 47.83 kg/m.Marland Kitchen Filed Weights   06/05/20 1334  Weight: (!) 138.5 kg    Family History  Problem Relation Age of Onset  . Lung cancer Mother 32       smoker  . Stroke Father 64  . Cancer Father        smoker-  . Colon polyps Neg Hx   . Colon cancer Neg Hx   . Esophageal cancer Neg Hx   . Rectal cancer Neg Hx   . Stomach cancer Neg Hx       Atrial Fibrillation Management history:  Previous antiarrhythmic drugs: flecainide Previous cardioversions: 04/11/20, 05/29/20 Previous ablations: none CHADS2VASC score: 1 Anticoagulation history: Eliquis, Xarelto   Past Medical History:  Diagnosis Date  . Hyperlipidemia    diet controlled   Past Surgical History:  Procedure Laterality Date  . CARDIOVERSION N/A 05/14/2020   Procedure: CARDIOVERSION;  Surgeon: Sueanne Margarita, MD;  Location[facility] ENDOSCOPY;  Service: Cardiovascular;  Laterality: N/A;  . CARDIOVERSION N/A 05/29/2020   Procedure: CARDIOVERSION;  Surgeon: [name], MD;  Location: [facility];  Service: Cardiovascular;  Laterality: N/A;  . TONSILLECTOMY    . TUBAL LIGATION    . WISDOM TOOTH EXTRACTION      Current Outpatient Medications  Medication Sig Dispense Refill  . atorvastatin (LIPITOR) 20 MG tablet Take 1 tablet (20 mg total) by mouth daily. 90 tablet 3  . metoprolol succinate (TOPROL-XL) 25 MG 24 hr tablet Take 1 tablet (25 mg total) by mouth daily. 30 tablet 3  . naproxen sodium (ALEVE) 220 MG tablet Take 220 mg by mouth as needed (mild pain).    . rivaroxaban (XARELTO) 20 MG TABS tablet Take 1 tablet (20 mg total) by mouth daily with supper. 30 tablet 3   No current facility-administered medications for this encounter.    Allergies  Allergen Reactions  . Penicillins Other (See Comments)    As a child- unknown reaction  Social History   Socioeconomic History  . Marital status: Married    Spouse name: Not on file  . Number of children: Not on file  . Years of education: Not on file  . Highest education level: Not on file  Occupational History  . Not on file  Tobacco Use  . Smoking status: Former Smoker    Types: Cigarettes  . Smokeless tobacco: Never Used  Vaping Use  . Vaping Use: Never used  Substance and Sexual Activity  . Alcohol use: Yes    Alcohol/week: 4.0 standard drinks    Types: 2 Glasses of wine, 2 Standard  drinks or equivalent per week  . Drug use: Never  . Sexual activity: Yes  Other Topics Concern  . Not on file  Social History Narrative  . Not on file   Social Determinants of Health   Financial Resource Strain: Not on file  Food Insecurity: Not on file  Transportation Needs: Not on file  Physical Activity: Not on file  Stress: Not on file  Social Connections: Not on file  Intimate Partner Violence: Not on file     ROS- All systems are reviewed and negative except as per the HPI above.  Physical Exam: Vitals:   06/05/20 1334  BP: 116/76  Pulse: 81  Weight: (!) 138.5 kg  Height: 5\' 7"  (1.702 m)    GEN- The patient is a well appearing obese female, alert and oriented x 3 today.   HEENT-head normocephalic, atraumatic, sclera clear, conjunctiva pink, hearing intact, trachea midline. Lungs- Clear to ausculation bilaterally, normal work of breathing Heart- irregular rate and rhythm, no murmurs, rubs or gallops  GI- soft, NT, ND, + BS Extremities- no clubbing, cyanosis, or edema MS- no significant deformity or atrophy Skin- no rash or lesion Psych- euthymic mood, full affect Neuro- strength and sensation are intact   Wt Readings from Last 3 Encounters:  06/05/20 (!) 138.5 kg  05/29/20 136.1 kg  05/21/20 (!) 137.4 kg    EKG today demonstrates  Typical atrial flutter with variable block Vent. rate 81 BPM PR interval * ms QRS duration 90 ms QT/QTcB 454/527 ms  Echo 04/11/20 demonstrated 1. Left ventricular ejection fraction, by estimation, is 50 to 55%. The  left ventricle has low normal function. The left ventricle has no regional  wall motion abnormalities. Left ventricular diastolic function could not  be evaluated.  2. Right ventricular systolic function is mildly reduced. The right  ventricular size is mildly enlarged.  3. Left atrial size was mildly dilated.  4. Right atrial size was mildly dilated.  5. The mitral valve is normal in structure. No  evidence of mitral valve  regurgitation.  6. The aortic valve is grossly normal. Aortic valve regurgitation is not  visualized.   Epic records are reviewed at length today  CHA2DS2-VASc Score = 1  The patient's score is based upon: CHF History: No HTN History: No Diabetes History: No Stroke History: No Vascular Disease History: No Age Score: 0 Gender Score: 1      ASSESSMENT AND PLAN: 1. Persistent Atrial Fibrillation/typical atrial flutter The patient's CHA2DS2-VASc score is 1, indicating a 0.6% annual risk of stroke.   S/p unsuccessful DCCV on 05/14/20, loaded on flecainide with repeat DCCV 05/29/20. She is in atrial flutter today. She did not feel significantly different in SR right after DCCV.  We discussed therapeutic options today including changing AAD therapy. She would like to focus on lifestyle changes for now and attempt to restore  SR after starting treatment for OSA. Unsure if she would be a candidate for ablation with her weight.  Will stop flecainide today.  Continue Xarelto 20 mg daily for now.  Continue Toprol 25 mg daily  2. Obesity Body mass index is 47.83 kg/m. Lifestyle modification was discussed and encouraged including regular physical activity and weight reduction. She is scheduled to begin [facility] exercise program.  3. Severe OSA She is picking up her BiPap tomorrow.    Follow up in the AF clinic in 3 months.    [facility] [address] [place], [place] [address] [phone] 06/05/2020 2:06 PM

## 2020-06-05 NOTE — Progress Notes (Signed)
Primary Care Physician: Kerin Perna, NP Primary Cardiologist: none Primary Electrophysiologist: none Referring Physician: Dr Rayne Du Bauer is a 60 y.o. female with a history of obesity, OSA, and atrial fibrillation who presents for follow up in the [facility].  The patient was initially diagnosed with atrial fibrillation 03/19/20 incedentally during a routine colonoscopy. ECG in recovery showed afib with RVR and she was sent to the ED. Per report, she was in [place] when she presented for the colonoscopy. She was asymptomatic. Patient has a CHADS2VASC score of 1. She denies alcohol use. She has been diagnosed with severe OSA. Patient is s/p DCCV 05/14/20 which was unsuccessful at restoring SR. She was loaded on flecainide.   On follow up today, patient is s/p repeat DCCV on 05/29/20. She required 3 shocks to convert. She is in atrial flutter today. She denies feeling any better in SR. She does have some dizziness and tiredness since starting flecainide. She denies any bleeding issues on anticoagulation.   Today, she denies symptoms of palpitations, chest pain, shortness of breath, orthopnea, PND, lower extremity edema, presyncope, syncope, bleeding, or neurologic sequela.     Atrial Fibrillation Risk Factors:  she does have symptoms or diagnosis of sleep apnea. Patient waiting on BiPap.  she does not have a history of rheumatic fever. she does not have a history of alcohol use. The patient does not have a history of early familial atrial fibrillation or other arrhythmias.  she has a BMI of Body mass index is 47.83 kg/m.Marland Kitchen Filed Weights   06/05/20 1334  Weight: (!) 138.5 kg    Family History  Problem Relation Age of Onset  . Lung cancer Mother 32       smoker  . Stroke Father 64  . Cancer Father        smoker-  . Colon polyps Neg Hx   . Colon cancer Neg Hx   . Esophageal cancer Neg Hx   . Rectal cancer Neg Hx   . Stomach cancer Neg Hx       Atrial Fibrillation Management history:  Previous antiarrhythmic drugs: flecainide Previous cardioversions: 04/11/20, 05/29/20 Previous ablations: none CHADS2VASC score: 1 Anticoagulation history: Eliquis, Xarelto   Past Medical History:  Diagnosis Date  . Hyperlipidemia    diet controlled   Past Surgical History:  Procedure Laterality Date  . CARDIOVERSION N/A 05/14/2020   Procedure: CARDIOVERSION;  Surgeon: Brittany Margarita, MD;  [facility] ENDOSCOPY;  Service: Cardiovascular;  Laterality: N/A;  . CARDIOVERSION N/A 05/29/2020   Procedure: CARDIOVERSION;  Surgeon: Brittany Latch, MD;  Location: [facility];  Service: Cardiovascular;  Laterality: N/A;  . TONSILLECTOMY    . TUBAL LIGATION    . WISDOM TOOTH EXTRACTION      Current Outpatient Medications  Medication Sig Dispense Refill  . atorvastatin (LIPITOR) 20 MG tablet Take 1 tablet (20 mg total) by mouth daily. 90 tablet 3  . metoprolol succinate (TOPROL-XL) 25 MG 24 hr tablet Take 1 tablet (25 mg total) by mouth daily. 30 tablet 3  . naproxen sodium (ALEVE) 220 MG tablet Take 220 mg by mouth as needed (mild pain).    . rivaroxaban (XARELTO) 20 MG TABS tablet Take 1 tablet (20 mg total) by mouth daily with supper. 30 tablet 3   No current facility-administered medications for this encounter.    Allergies  Allergen Reactions  . Penicillins Other (See Comments)    As a child- unknown reaction  Social History   Socioeconomic History  . Marital status: Married    Spouse name: Not on file  . Number of children: Not on file  . Years of education: Not on file  . Highest education level: Not on file  Occupational History  . Not on file  Tobacco Use  . Smoking status: Former Smoker    Types: Cigarettes  . Smokeless tobacco: Never Used  Vaping Use  . Vaping Use: Never used  Substance and Sexual Activity  . Alcohol use: Yes    Alcohol/week: 4.0 standard drinks    Types: 2 Glasses of wine, 2 Standard  drinks or equivalent per week  . Drug use: Never  . Sexual activity: Yes  Other Topics Concern  . Not on file  Social History Narrative  . Not on file   Social Determinants of Health   Financial Resource Strain: Not on file  Food Insecurity: Not on file  Transportation Needs: Not on file  Physical Activity: Not on file  Stress: Not on file  Social Connections: Not on file  Intimate Partner Violence: Not on file     ROS- All systems are reviewed and negative except as per the HPI above.  Physical Exam: Vitals:   06/05/20 1334  BP: 116/76  Pulse: 81  Weight: (!) 138.5 kg  Height: 5' 7" (1.702 m)    GEN- The patient is a well appearing obese female, alert and oriented x 3 today.   HEENT-head normocephalic, atraumatic, sclera clear, conjunctiva pink, hearing intact, trachea midline. Lungs- Clear to ausculation bilaterally, normal work of breathing Heart- irregular rate and rhythm, no murmurs, rubs or gallops  GI- soft, NT, ND, + BS Extremities- no clubbing, cyanosis, or edema MS- no significant deformity or atrophy Skin- no rash or lesion Psych- euthymic mood, full affect Neuro- strength and sensation are intact   Wt Readings from Last 3 Encounters:  06/05/20 (!) 138.5 kg  05/29/20 136.1 kg  05/21/20 (!) 137.4 kg    EKG today demonstrates  Typical atrial flutter with variable block Vent. rate 81 BPM PR interval * ms QRS duration 90 ms QT/QTcB 454/527 ms  Echo 04/11/20 demonstrated 1. Left ventricular ejection fraction, by estimation, is 50 to 55%. The  left ventricle has low normal function. The left ventricle has no regional  wall motion abnormalities. Left ventricular diastolic function could not  be evaluated.  2. Right ventricular systolic function is mildly reduced. The right  ventricular size is mildly enlarged.  3. Left atrial size was mildly dilated.  4. Right atrial size was mildly dilated.  5. The mitral valve is normal in structure. No  evidence of mitral valve  regurgitation.  6. The aortic valve is grossly normal. Aortic valve regurgitation is not  visualized.   Epic records are reviewed at length today  CHA2DS2-VASc Score = 1  The patient's score is based upon: CHF History: No HTN History: No Diabetes History: No Stroke History: No Vascular Disease History: No Age Score: 0 Gender Score: 1      ASSESSMENT AND PLAN: 1. Persistent Atrial Fibrillation/typical atrial flutter The patient's CHA2DS2-VASc score is 1, indicating a 0.6% annual risk of stroke.   S/p unsuccessful DCCV on 05/14/20, loaded on flecainide with repeat DCCV 05/29/20. She is in atrial flutter today. She did not feel significantly different in SR right after DCCV.  We discussed therapeutic options today including changing AAD therapy. She would like to focus on lifestyle changes for now and attempt to restore   SR after starting treatment for OSA. Unsure if she would be a candidate for ablation with her weight.  Will stop flecainide today.  Continue Xarelto 20 mg daily for now.  Continue Toprol 25 mg daily  2. Obesity Body mass index is 47.83 kg/m. Lifestyle modification was discussed and encouraged including regular physical activity and weight reduction. She is scheduled to begin [employer] exercise program.  3. Severe OSA She is picking up her BiPap tomorrow.    Follow up in the AF clinic in 3 months.    Brittany Patrena Santalucia PA-C [facility] [address] [place], [place] [address] [phone] 06/05/2020 2:06 PM 

## 2020-06-20 NOTE — Interval H&P Note (Signed)
History and Physical Interval Note:  06/20/2020 9:54 AM  Brittany Bauer  has presented today for surgery, with the diagnosis of A-FIB.  The various methods of treatment have been discussed with the patient and family. After consideration of risks, benefits and other options for treatment, the patient has consented to  Procedure(s): CARDIOVERSION (N/A) as a surgical intervention.  The patient's history has been reviewed, patient examined, no change in status, stable for surgery.  I have reviewed the patient's chart and labs.  Questions were answered to the patient's satisfaction.     [name] MD

## 2020-07-26 ENCOUNTER — Other Ambulatory Visit (HOSPITAL_COMMUNITY): Payer: Self-pay | Admitting: *Deleted

## 2020-07-26 ENCOUNTER — Other Ambulatory Visit (HOSPITAL_COMMUNITY): Payer: Self-pay | Admitting: Physician Assistant

## 2020-07-26 MED ORDER — METOPROLOL SUCCINATE ER 25 MG PO TB24: 25.0000 mg | ORAL_TABLET | Freq: Every day | ORAL | 3 refills | Status: DC

## 2020-08-13 ENCOUNTER — Ambulatory Visit (INDEPENDENT_AMBULATORY_CARE_PROVIDER_SITE_OTHER): Payer: 59 | Admitting: Primary Care

## 2020-08-13 ENCOUNTER — Other Ambulatory Visit: Payer: Self-pay

## 2020-08-13 ENCOUNTER — Encounter (INDEPENDENT_AMBULATORY_CARE_PROVIDER_SITE_OTHER): Payer: Self-pay | Admitting: Primary Care

## 2020-08-13 VITALS — BP 111/68 | HR 83 | Temp 97.7°F | Ht 67.0 in | Wt 306.0 lb

## 2020-08-13 DIAGNOSIS — E782 Mixed hyperlipidemia: Secondary | ICD-10-CM | POA: Diagnosis not present

## 2020-08-13 DIAGNOSIS — Z1231 Encounter for screening mammogram for malignant neoplasm of breast: Secondary | ICD-10-CM

## 2020-08-13 DIAGNOSIS — I4819 Other persistent atrial fibrillation: Secondary | ICD-10-CM

## 2020-08-13 DIAGNOSIS — Z0000 Encounter for general adult medical examination without abnormal findings: Secondary | ICD-10-CM

## 2020-08-13 DIAGNOSIS — Z79899 Other long term (current) drug therapy: Secondary | ICD-10-CM | POA: Diagnosis not present

## 2020-08-13 NOTE — Patient Instructions (Addendum)
Please call [facility] to schedule your mammogram at [phone]

## 2020-08-13 NOTE — Progress Notes (Signed)
[place]  Patient name: Brittany Bauer MRN [id number]  Date of birth: 07/24/1960 Chief Complaint:   Follow-up  History of Present Illness:   Brittany Bauer is a 60 y.o. No obstetric history on file. female being seen today for a routine well-woman exam.   AX:[id number]    The current method of family planning is none.  No LMP recorded. (Menstrual status: Perimenopausal). Last pap 02/13/20. Results were: normal Last mammogram: refer .  Family h/o breast cancer: Yes Last colonoscopy: 03/19/20. Results were: abnormal  polyp  . Family h/o colorectal cancer: No  Review of Systems:    Denies any headaches, blurred vision, fatigue, shortness of breath, chest pain, abdominal pain, abnormal vaginal discharge/itching/odor/irritation, problems with periods, bowel movements, urination, or intercourse unless otherwise stated above.  Pertinent History Reviewed:   Reviewed past medical,surgical, social and family history.  Reviewed problem list, medications and allergies.  Physical Assessment:   Vitals:   08/13/20 0943  BP: 111/68  Pulse: 83  Temp: 97.7 F (36.5 C)  TempSrc: Temporal  SpO2: 93%  Weight: (!) 306 lb (138.8 kg)  Height: _0  (1.702 m)  Body mass index is 47.93 kg/m.        Physical Examination:  General appearance - well appearing, and in no distress, morbid obesity  Mental status - alert, oriented to person, place, and time Psych:  She has a normal mood and affect Skin - warm and dry, normal color, no suspicious lesions noted Chest - effort normal, all lung fields clear to auscultation bilaterally Heart - normal rate and regular rhythm Neck:  midline trachea, no thyromegaly or nodules Abdomen - soft, nontender, nondistended, no masses or organomegaly Extremities:  No swelling or varicosities noted  No results found for this or any previous visit (from the past 24 hour(s)).   Assessment & Plan:  [name] was seen today for  follow-up.  Diagnoses and all orders for this visit:  1) Annual Visit  Completed    2) Morbid Obesity  Morbid Obesity is > BMI 40  indicating an excess in caloric intake or underlining conditions. This may lead to other co-morbidities. Lifestyle modifications of diet and exercise may reduce obesity.  Will discuss with cardiology options medication v/s surgery  Labs/procedures today: CBC, CMP, Lipid   Mammogram due  or sooner if problems Colonoscopy 03/19/20 or sooner if problems  Mixed hyperlipidemia Morbid Obesity is > 40 indicating an excess in caloric intake or underlining conditions. This may lead to other co-morbidities. Lifestyle modifications of diet and exercise may reduce obesity.    Persistent atrial fibrillation [facility]) Cardiology    Encounter for screening mammogram for malignant neoplasm of breast -     MM DIGITAL SCREENING BILATERAL; Future   Medication management -     CBC with Differential -     CMP14+EGFR   Meds:  Current Outpatient Medications on File Prior to Visit  Medication Sig Dispense Refill   atorvastatin (LIPITOR) 20 MG tablet Take 1 tablet (20 mg total) by mouth daily. 90 tablet 3   metoprolol succinate (TOPROL-XL) 25 MG 24 hr tablet Take 1 tablet (25 mg total) by mouth daily. 30 tablet 3   naproxen sodium (ALEVE) 220 MG tablet Take 220 mg by mouth as needed (mild pain).     XARELTO 20 MG TABS tablet TAKE 1 TABLET(20 MG) BY MOUTH DAILY WITH SUPPER 30 tablet 3   No current facility-administered medications on file prior to visit.     Follow-up: No  follow-ups on file.  This note has been created with Surveyor, quantity. Any transcriptional errors are unintentional.   Kerin Perna, NP 08/13/2020, 10:29 AM

## 2020-08-14 LAB — CBC WITH DIFFERENTIAL/PLATELET
Basophils Absolute: 0 10*3/uL (ref 0.0–0.2)
Basos: 1 %
EOS (ABSOLUTE): 0.1 10*3/uL (ref 0.0–0.4)
Eos: 1 %
Hematocrit: 47.4 % — ABNORMAL HIGH (ref 34.0–46.6)
Hemoglobin: 15 g/dL (ref 11.1–15.9)
Immature Grans (Abs): 0 10*3/uL (ref 0.0–0.1)
Immature Granulocytes: 0 %
Lymphocytes Absolute: 2.4 10*3/uL (ref 0.7–3.1)
Lymphs: 38 %
MCH: 27.1 pg (ref 26.6–33.0)
MCHC: 31.6 g/dL (ref 31.5–35.7)
MCV: 86 fL (ref 79–97)
Monocytes Absolute: 0.4 10*3/uL (ref 0.1–0.9)
Monocytes: 6 %
Neutrophils Absolute: 3.4 10*3/uL (ref 1.4–7.0)
Neutrophils: 54 %
Platelets: 200 10*3/uL (ref 150–450)
RBC: 5.54 x10E6/uL — ABNORMAL HIGH (ref 3.77–5.28)
RDW: 13.6 % (ref 11.7–15.4)
WBC: 6.3 10*3/uL (ref 3.4–10.8)

## 2020-08-14 LAB — CMP14+EGFR
ALT: 19 IU/L (ref 0–32)
AST: 19 IU/L (ref 0–40)
Albumin/Globulin Ratio: 1.8 (ref 1.2–2.2)
Albumin: 4.2 g/dL (ref 3.8–4.8)
Alkaline Phosphatase: 95 IU/L (ref 44–121)
BUN/Creatinine Ratio: 19 (ref 12–28)
BUN: 17 mg/dL (ref 8–27)
Bilirubin Total: 0.8 mg/dL (ref 0.0–1.2)
CO2: 25 mmol/L (ref 20–29)
Calcium: 9.6 mg/dL (ref 8.7–10.3)
Chloride: 102 mmol/L (ref 96–106)
Creatinine, Ser: 0.88 mg/dL (ref 0.57–1.00)
Globulin, Total: 2.3 g/dL (ref 1.5–4.5)
Glucose: 100 mg/dL — ABNORMAL HIGH (ref 65–99)
Potassium: 5.1 mmol/L (ref 3.5–5.2)
Sodium: 141 mmol/L (ref 134–144)
Total Protein: 6.5 g/dL (ref 6.0–8.5)
eGFR: 75 mL/min/{1.73_m2} (ref >59)

## 2020-08-14 LAB — LIPID PANEL
Chol/HDL Ratio: 3.4 ratio (ref 0.0–4.4)
Cholesterol, Total: 139 mg/dL (ref 100–199)
HDL: 41 mg/dL (ref >39)
LDL Chol Calc (NIH): 70 mg/dL (ref 0–99)
Triglycerides: 162 mg/dL — ABNORMAL HIGH (ref 0–149)
VLDL Cholesterol Cal: 28 mg/dL (ref 5–40)

## 2020-08-15 ENCOUNTER — Ambulatory Visit (INDEPENDENT_AMBULATORY_CARE_PROVIDER_SITE_OTHER): Payer: 59 | Admitting: Cardiovascular Disease

## 2020-08-15 ENCOUNTER — Encounter: Payer: Self-pay | Admitting: Cardiovascular Disease

## 2020-08-15 ENCOUNTER — Other Ambulatory Visit: Payer: Self-pay

## 2020-08-15 VITALS — BP 130/74 | HR 86 | Ht 67.0 in | Wt 305.2 lb

## 2020-08-15 DIAGNOSIS — G4733 Obstructive sleep apnea (adult) (pediatric): Secondary | ICD-10-CM | POA: Diagnosis not present

## 2020-08-15 DIAGNOSIS — I4819 Other persistent atrial fibrillation: Secondary | ICD-10-CM

## 2020-08-15 DIAGNOSIS — Z79899 Other long term (current) drug therapy: Secondary | ICD-10-CM

## 2020-08-15 DIAGNOSIS — Z7901 Long term (current) use of anticoagulants: Secondary | ICD-10-CM

## 2020-08-15 DIAGNOSIS — E782 Mixed hyperlipidemia: Secondary | ICD-10-CM | POA: Diagnosis not present

## 2020-08-15 MED ORDER — METOPROLOL SUCCINATE ER 50 MG PO TB24: 50.0000 mg | ORAL_TABLET | Freq: Every day | ORAL | 3 refills | Status: DC

## 2020-08-15 NOTE — Progress Notes (Signed)
Cardiology Office Note    Date:  08/19/2020   ID:  Brittany Bauer, DOB 1960-08-14, MRN [id number]  PCP:  Kerin Perna, NP  Cardiologist:  Shelva Majestic, MD (sleep); Dr. Audie Box EP: Malka So, PA  No chief complaint on file.   History of Present Illness:  Brittany Bauer is a 60 y.o. female who has a history of obesity, hyperlipidemia, and presented for routine colonoscopy and was found to be in atrial fibrillation in February 2022.  She was sent to the emergency room for further evaluation.  Metoprolol succinate was added to her medical regimen and she was started on anticoagulation with Xarelto.  She has been subsequently followed in the atrial fibrillation clinic.  She underwent initial DC cardioversion in May 14, 2020 which was unsuccessful at restoring sinus rhythm.  She was subsequently treated with flecainide.  She underwent follow-up DC cardioversion on May 29, 2020 and required 3 shocks with ultimate successful cardioversion.  When she was seen back in atrial fibrillation clinic on Jun 05, 2020, she was in atrial flutter.  Because of concerns for obstructive sleep apnea, she was referred for a split-night sleep study which was done on April 26, 2020.  Symptoms included loud snoring, witnessed apnea, nocturia 1-2 times per night, nonrestorative sleep, as well as daytime sleepiness.  Her sleep study revealed very severe sleep apnea and on the diagnostic portion of the study AHI was 92.6/h and she was unable to achieve any REM sleep.  Oxygen desaturated to a nadir of 71%.  She underwent CPAP titration and required ultimate transition to BiPAP due to continued events and was titrated up to 20/16 at which time her AHI was 0 and O2 nadir 87%.   Her BiPAP set up date was Jun 06, 2020 with [facility] as her DME company.  Her initial download from May 18 through July 05, 2020 confirms excellent compliance with 100% use.  Average use was 6 hours and 42 minutes.  She was set at a  minimum EPAP pressure of 16, pressure support of 4, with potential maximum IPAP of 25 centimeters of water.  Her 95th percentile pressure is 22.6/18.6 and maximum average pressure is 23.9/19.9.  AHI is excellent at 3.0/h.  Since initiating CPAP therapy she is sleeping significantly better.  She is unaware of breakthrough snoring.  A new Epworth Sleepiness Scale score was calculated in the office today and this endorsed at 7 arguing against residual daytime sleepiness.  She has a history of morbid obesity.  Upon further questioning 35 years ago she weighed 160 pounds and her weight today is 305 pounds.  Several polyps were noted at her subsequent colonoscopy.  She had smoked for 25 years but fortunately quit smoking 15 years ago.  She has hyperlipidemia and is on atorvastatin 20 mg.  She continues to be on metoprolol 25 mg twice a day and Xarelto 20 mg daily.  She presents for her initial evaluation.  Past Medical History:  Diagnosis Date   Hyperlipidemia    diet controlled    Past Surgical History:  Procedure Laterality Date   CARDIOVERSION N/A 05/14/2020   Procedure: CARDIOVERSION;  Surgeon: Sueanne Margarita, MD;  Location: [facility] ENDOSCOPY;  Service: Cardiovascular;  Laterality: N/A;   CARDIOVERSION N/A 05/29/2020   Procedure: CARDIOVERSION;  Surgeon: [name], MD;  Location: [facility];  Service: Cardiovascular;  Laterality: N/A;   TONSILLECTOMY     TUBAL LIGATION     WISDOM TOOTH EXTRACTION  Current Medications: Outpatient Medications Prior to Visit  Medication Sig Dispense Refill   naproxen sodium (ALEVE) 220 MG tablet Take 220 mg by mouth as needed (mild pain).     XARELTO 20 MG TABS tablet TAKE 1 TABLET(20 MG) BY MOUTH DAILY WITH SUPPER 30 tablet 3   atorvastatin (LIPITOR) 20 MG tablet Take 1 tablet (20 mg total) by mouth daily. 90 tablet 3   metoprolol succinate (TOPROL-XL) 25 MG 24 hr tablet Take 1 tablet (25 mg total) by mouth daily. 30 tablet 3   No  facility-administered medications prior to visit.     Allergies:   Penicillins   Social History   Socioeconomic History   Marital status: Married    Spouse name: Not on file   Number of children: Not on file   Years of education: Not on file   Highest education level: Not on file  Occupational History   Not on file  Tobacco Use   Smoking status: Former    Types: Cigarettes   Smokeless tobacco: Never  Vaping Use   Vaping Use: Never used  Substance and Sexual Activity   Alcohol use: Yes    Alcohol/week: 4.0 standard drinks    Types: 2 Glasses of wine, 2 Standard drinks or equivalent per week   Drug use: Never   Sexual activity: Yes  Other Topics Concern   Not on file  Social History Narrative   Not on file   Social Determinants of Health   Financial Resource Strain: Not on file  Food Insecurity: Not on file  Transportation Needs: Not on file  Physical Activity: Not on file  Stress: Not on file  Social Connections: Not on file    Socially, she was born in [place] but moved all over including [place], [place], and has been in [place] for 1-1/2 years.  This is her second marriage currently at 25 years.  She has 2 children together and both have 1 child from a prior marriage.  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  Family History:  The patient's family history includes Cancer in her father; Lung cancer (age of onset: 102) in her mother; Stroke (age of onset: 3) in her father.   Her father died at age 41 and at history of lung cancer and CVA.  Mother died at age 62 and had brain cancer.  She has 1 brother and 1 sister.  ROS General: Negative; No fevers, chills, or night sweats; morbid obesity HEENT: Negative; No changes in vision or hearing, sinus congestion, difficulty swallowing Pulmonary: Negative; No cough, wheezing, shortness of breath, hemoptysis Cardiovascular: Atrial  fibrillation GI: Negative; No nausea, vomiting, diarrhea, or abdominal pain GU: Negative; No dysuria, hematuria, or difficulty voiding Musculoskeletal: Negative; no myalgias, joint pain, or weakness Hematologic/Oncology: Negative; no easy bruising, bleeding Endocrine: Negative; no heat/cold intolerance; no diabetes Neuro: Negative; no changes in balance, headaches Skin: Negative; No rashes or skin lesions Psychiatric: Negative; No behavioral problems, depression Sleep: Negative; No snoring, daytime sleepiness, hypersomnolence, bruxism, restless legs, hypnogognic hallucinations, no cataplexy Other comprehensive 14 point system review is negative.   PHYSICAL EXAM:   VS:  BP 130/74   Pulse 86   Ht 5' 7"  (1.702 m)   Wt (!) 305 lb 3.2 oz (138.4 kg)   SpO2 94%   BMI 47.80 kg/m     Repeat blood pressure by me was 144/70  Wt  Readings from Last 3 Encounters:  08/15/20 (!) 305 lb 3.2 oz (138.4 kg)  08/13/20 (!) 306 lb (138.8 kg)  06/05/20 (!) 305 lb 6.4 oz (138.5 kg)    General: Alert, oriented, no distress.  Skin: normal turgor, no rashes, warm and dry HEENT: Normocephalic, atraumatic. Pupils equal round and reactive to light; sclera anicteric; extraocular muscles intact;  Nose without nasal septal hypertrophy Mouth/Parynx benign; Mallinpatti scale 3/4 Neck: No JVD, no carotid bruits; normal carotid upstroke Lungs: clear to ausculatation and percussion; no wheezing or rales Chest wall: without tenderness to palpitation Heart: PMI not displaced, irregularly irregular consistent with atrial fibrillation with ventricular rate in the mid 80s, s1 s2 normal, 1/6 systolic murmur, no diastolic murmur, no rubs, gallops, thrills, or heaves Abdomen: soft, nontender; no hepatosplenomehaly, BS+; abdominal aorta nontender and not dilated by palpation. Back: no CVA tenderness Pulses 2+ Musculoskeletal: full range of motion, normal strength, no joint deformities Extremities: no clubbing cyanosis or  edema, Homan's sign negative  Neurologic: grossly nonfocal; Cranial nerves grossly wnl Psychologic: Normal mood and affect   Studies/Labs Reviewed:   EKG:  EKG is ordered today.  ECG (independently read by me): Atrial fibrillation at 86 bpm.  No significant ST-T abnormalities.  QTc interval 437 ms.  Recent Labs: BMP Latest Ref Rng & Units 08/13/2020 05/21/2020 05/02/2020  Glucose 65 - 99 mg/dL 100(H) 81 103(H)  BUN 8 - 27 mg/dL 17 15 19   Creatinine 0.57 - 1.00 mg/dL 0.88 0.77 0.83  BUN/Creat Ratio 12 - 28 19 - -  Sodium 134 - 144 mmol/L 141 139 141  Potassium 3.5 - 5.2 mmol/L 5.1 5.0 4.9  Chloride 96 - 106 mmol/L 102 105 104  CO2 20 - 29 mmol/L 25 28 29   Calcium 8.7 - 10.3 mg/dL 9.6 8.9 9.2     Hepatic Function Latest Ref Rng & Units 08/13/2020 01/12/2020  Total Protein 6.0 - 8.5 g/dL 6.5 6.7  Albumin 3.8 - 4.8 g/dL 4.2 4.2  AST 0 - 40 IU/L 19 16  ALT 0 - 32 IU/L 19 16  Alk Phosphatase 44 - 121 IU/L 95 90  Total Bilirubin 0.0 - 1.2 mg/dL 0.8 0.5    CBC Latest Ref Rng & Units 08/13/2020 05/21/2020 05/02/2020  WBC 3.4 - 10.8 x10E3/uL 6.3 6.5 6.0  Hemoglobin 11.1 - 15.9 g/dL 15.0 16.0(H) 15.0  Hematocrit 34.0 - 46.6 % 47.4(H) 50.1(H) 47.7(H)  Platelets 150 - 450 x10E3/uL 200 190 212   Lab Results  Component Value Date   MCV 86 08/13/2020   MCV 87.1 05/21/2020   MCV 89.0 05/02/2020   Lab Results  Component Value Date   TSH 4.330 01/12/2020   No results found for: HGBA1C   BNP No results found for: BNP  ProBNP No results found for: PROBNP   Lipid Panel     Component Value Date/Time   CHOL 139 08/13/2020 1046   TRIG 162 (H) 08/13/2020 1046   HDL 41 08/13/2020 1046   CHOLHDL 3.4 08/13/2020 1046   LDLCALC 70 08/13/2020 1046   LABVLDL 28 08/13/2020 1046     RADIOLOGY: No results found.   Additional studies/ records that were reviewed today include:    ECHO: 04/11/2020 IMPRESSIONS   1. Left ventricular ejection fraction, by estimation, is 50 to 55%. The   left ventricle has low normal function. The left ventricle has no regional  wall motion abnormalities. Left ventricular diastolic function could not  be evaluated.   2. Right ventricular systolic function is  mildly reduced. The right  ventricular size is mildly enlarged.   3. Left atrial size was mildly dilated.   4. Right atrial size was mildly dilated.   5. The mitral valve is normal in structure. No evidence of mitral valve  regurgitation.   6. The aortic valve is grossly normal. Aortic valve regurgitation is not  visualized.     04/26/2020 CLINICAL INFORMATION The patient is referred for a split night study with BPAP. MEDICATIONS atorvastatin (LIPITOR) 20 MG tablet metoprolol succinate (TOPROL-XL) 25 MG 24 hr tablet naproxen sodium (ALEVE) 220 MG tablet rivaroxaban (XARELTO) 20 MG TABS tablet   Medications self-administered by patient taken the night of the study : N/A   SLEEP STUDY TECHNIQUE As per the AASM Manual for the Scoring of Sleep and Associated Events v2.3 (April 2016) with a hypopnea requiring 4% desaturations.   The channels recorded and monitored were frontal, central and occipital EEG, electrooculogram (EOG), submentalis EMG (chin), nasal and oral airflow, thoracic and abdominal wall motion, anterior tibialis EMG, snore microphone, electrocardiogram, and pulse oximetry. Bi-level positive airway pressure (BiPAP) was initiated when the patient met split night criteria and was titrated according to treat sleep-disordered breathing.   RESPIRATORY PARAMETERS Diagnostic Total AHI (/hr):            92.6     RDI (/hr):         92.6     OA Index (/hr):            73.9     CA Index (/hr):      0.0 REM AHI (/hr):            N/A      NREM AHI (/hr):          92.6     Supine AHI (/hr):         85.8     Non-supine AHI (/hr):        92.93 Min O2 Sat (%):          71.0     Mean O2 (%):  88.0     Time below 88% (min):           84           Titration Optimal IPAP Pressure (cm):  20        Optimal EPAP Pressure (cm):            16        AHI at Optimal Pressure (/hr):          0          Min O2 at Optimal Pressure (%):       87.0 Sleep % at Optimal (%):         97        Supine % at Optimal (%):       100         SLEEP ARCHITECTURE The study was initiated at 10:14:22 PM and terminated at 5:10:27 AM. The total recorded time was 416.1 minutes. EEG confirmed total sleep time was 386.5 minutes yielding a sleep efficiency of 92.9%%. Sleep onset after lights out was 8.1 minutes with a REM latency of 194.0 minutes. The patient spent 6.0%% of the night in stage N1 sleep, 71.8%% in stage N2 sleep, 0.1%% in stage N3 and 22.1% in REM. Wake after sleep onset (WASO) was 21.4 minutes. The Arousal Index was 14.7/hour.   LEG MOVEMENT DATA The total Periodic Limb Movements of  Sleep (PLMS) were 0. The PLMS index was 0.0 .   CARDIAC DATA The 2 lead EKG demonstrated sinus rhythm. The mean heart rate was 100.0 beats per minute. Other EKG findings include: Atrial Fibrillation.   IMPRESSIONS - Severe obstructive sleep apnea occurred during the diagnostic portion of the study (AHI 92.6 /h with absent REM sleep).  CPAP was initiated at 5 cm, was titrated to 13 cm and due to significant events was transitioned to BiPAP at 14/10 and titrated to 20/16; AHI 0, O2 nadir 87%. - No significant central sleep apnea occurred during the diagnostic portion of the study (CAI = 0.0/hour). - Severe oxygen desaturation was noted during the diagnostic portion of the study to a nadir of 71%. - No snoring was audible during this study. - EKG findings include Atrial Fibrillation, PVC - Clinically significant periodic limb movements of sleep did not occur during the study.   DIAGNOSIS - Obstructive Sleep Apnea (G47.33)   RECOMMENDATIONS - Recommend an initial trial of BiPAP Auto therapy on with EPAP min of 16, PS of 4 and IPAP max of 25 cm H2O with heated humidification. A Medium size Fisher&Paykel Full Face Mask  Simplus mask was used for the titration. - Effort should be made to optimize nasal and oropharyngeal patency. - Avoid alcohol, sedatives and other CNS depressants that may worsen sleep apnea and disrupt normal sleep architecture. - Sleep hygiene should be reviewed to assess factors that may improve sleep quality. - Weight management and regular exercise should be initiated or continued. - Recommend a download in 30 days and sleep clinice evaluation after 4 weeks of therapy.  ASSESSMENT:    1. OSA (obstructive sleep apnea)   2. Persistent atrial fibrillation ([place])   3. Morbid obesity ([place])   4. Mixed hyperlipidemia   5. Anticoagulated   6. Medication management     PLAN:  [name] is a very pleasant 60 year old female who has a history of morbid obesity, hyperlipidemia, and recently diagnosed obstructive sleep apnea.  She had undergone attempt at initial cardioversion in April 2022 which was unsuccessful but subsequently after being loaded with flecainide underwent successful cardioversion on May 29, 2020.  Unfortunately, when seen 1 week later likely fainted, PA she was in atrial flutter with variable block at 81 bpm.  QTc interval was prolonged at 527 milliseconds.  Her EKG today shows that she is in atrial fibrillation which is persistent.  She is now only metoprolol succinate 25 mg daily for rate control in addition to Xarelto for anticoagulation.  She admits to a history highly suggestive of sleep apnea and prior to her split-night study for evaluation symptoms included loud snoring, witnessed apnea, nocturia 1-2 times per night, nonrestorative sleep, and daytime sleepiness.  Typically she goes to bed at midnight and wakes up between 7 and 8 AM.  She had a 25-year history of tobacco use but fortunately quit 15 years ago.  I spent considerable time with her today and reviewed her sleep study in detail.  Her sleep study demonstrates very severe sleep apnea with an AHI of over 90 times  per hour with absence of REM sleep on the diagnostic portion of the study.  She had significant oxygen desaturation to a nadir of 71%.  She required ultimately transition to BiPAP during the titration portion of the study and is on very elevated pressures.  Her's BiPAP set up date was on Jun 06, 2020.  Her initial download demonstrates excellent compliance with 100% use.  Usage however was only 6 hours and 42 minutes.  She initially was set at a EPAP minimum of 16 with maximum IPAP of 25 and pressure support of 4.  She has required high pressures and her 95th percentile pressure was 22.6/18.6 with maximum average pressure 23.9/19.9.  I also obtained a subsequent download from June 27 through August 14, 2020.  During that time, usage was further depressed at 5 hours and 53 minutes and she continued to require near maximal pressures.  AHI was 2.2.  She has been using a full facemask, and there have been some nights with leak.  I reviewed with her new mask technology and feels she may benefit from a trial of the ResMed AirFit F30i mask where the tubing will arise from the crown of the head and the nasal portion is under the nose.  This should improve comfort as well as reduction in vision.  I had a lengthy discussion with her today regarding normal sleep architecture and the effects of sleep apnea creating abnormal sleep architecture.  I specifically discussed the increased sympathetic activity associated with apneic events and arousals.  I discussed its effect on blood pressure and the reduction in the normal diastolic dip during sleep if sleep apnea is present.  In addition I discussed potential nocturnal arrhythmias, effects of nocturnal oxygen desaturation potentially contributing to nocturnal ischemia particularly if there is underlying coronary or cerebrovascular atherosclerosis.  I discussed its effects on glucose metabolism, GERD, inflammation, as well as improved sleep duration contributing to more cognitive  alertness and resolution of residual daytime sleepiness.  I discussed the pathophysiology associated with increased nocturia secondary to untreated sleep apnea.  Clinically she feels significantly improved but has been taking the CPAP off at the latter portion of the night at times.  I discussed with her that the predominance of REM sleep occurs in the second half of the night and typically sleep apnea is more severe and REM sleep compared to non-REM sleep and the importance of using CPAP for the entire nights duration.  I discussed optimal sleep duration for adult between 7 to 9 hours.  I answered all her questions.  Her husband was with her in the room who also was on CPAP therapy and I believe he derive significant benefit in understanding the pathophysiology and potential cardiovascular consequences associated with sleep apnea.  She continues to be in atrial fibrillation.  With her ventricular rate in the mid 80s I have recommended titration of metoprolol succinate to 50 mg daily.  She continues to be on Xarelto for anticoagulation.  She is on atorvastatin for hyperlipidemia and is tolerating this well.  She has a follow-up appointment in the atrial fibrillation clinic in 2 weeks.  With her super morbid obesity, I discussed the importance of weight loss and increased exercise.  We discussed potentially exercising at least 5 days/week for minimum of 30 minutes if at all possible.  From a sleep perspective, she is doing well and I will see her in 1 year for reevaluation or sooner as needed.  Time spent: 45 minutes Medication Adjustments/Labs and Tests Ordered: Current medicines are reviewed at length with the patient today.  Concerns regarding medicines are outlined above.  Medication changes, Labs and Tests ordered today are listed in the Patient Instructions below. Patient Instructions  Medication Instructions:  INCREASE metoprolol succinate (Toprol XL) to 7m daily. Per Dr. KClaiborne Billingsyou may take two of  your 268mmetoprolol succinate tablets daily until they are gone. Then  you may pick up your 47m (1 tablet) daily prescription.   *If you need a refill on your cardiac medications before your next appointment, please call your pharmacy*   Lab Work: None ordered.    Testing/Procedures: None ordered.    Follow-Up: At [facility] you and your health needs are our priority.  As part of our continuing mission to provide you with exceptional heart care, we have created designated Provider Care Teams.  These Care Teams include your primary Cardiologist (physician) and Advanced Practice Providers (APPs -  Physician Assistants and Nurse Practitioners) who all work together to provide you with the care you need, when you need it.  We recommend signing up for the patient portal called "MyChart".  Sign up information is provided on this After Visit Summary.  MyChart is used to connect with patients for Virtual Visits (Telemedicine).  Patients are able to view lab/test results, encounter notes, upcoming appointments, etc.  Non-urgent messages can be sent to your provider as well.   To learn more about what you can do with MyChart, go to [email or web address]    Your next appointment:   12 month(s)  The format for your next appointment:   In Person  Provider:   TShelva Majestic MD      Signed, TShelva Majestic MD  08/19/2020 1:54 PM    [place] [place]  [id number]: [phone]

## 2020-08-15 NOTE — Patient Instructions (Signed)
Medication Instructions:  INCREASE metoprolol succinate (Toprol XL) to '50mg'$  daily. Per Dr. Claiborne Billings you may take two of your '25mg'$  metoprolol succinate tablets daily until they are gone. Then you may pick up your '50mg'$  (1 tablet) daily prescription.   *If you need a refill on your cardiac medications before your next appointment, please call your pharmacy*   Lab Work: None ordered.    Testing/Procedures: None ordered.    Follow-Up: At [facility], you and your health needs are our priority.  As part of our continuing mission to provide you with exceptional heart care, we have created designated Provider Care Teams.  These Care Teams include your primary Cardiologist (physician) and Advanced Practice Providers (APPs -  Physician Assistants and Nurse Practitioners) who all work together to provide you with the care you need, when you need it.  We recommend signing up for the patient portal called "MyChart".  Sign up information is provided on this After Visit Summary.  MyChart is used to connect with patients for Virtual Visits (Telemedicine).  Patients are able to view lab/test results, encounter notes, upcoming appointments, etc.  Non-urgent messages can be sent to your provider as well.   To learn more about what you can do with MyChart, go to [email or web address].    Your next appointment:   12 month(s)  The format for your next appointment:   In Person  Provider:   Shelva Majestic, MD

## 2020-08-16 ENCOUNTER — Other Ambulatory Visit (INDEPENDENT_AMBULATORY_CARE_PROVIDER_SITE_OTHER): Payer: Self-pay | Admitting: Primary Care

## 2020-08-16 DIAGNOSIS — E782 Mixed hyperlipidemia: Secondary | ICD-10-CM

## 2020-08-16 MED ORDER — GEMFIBROZIL 600 MG PO TABS: 600.0000 mg | ORAL_TABLET | Freq: Two times a day (BID) | ORAL | 1 refills | Status: DC

## 2020-08-19 ENCOUNTER — Encounter: Payer: Self-pay | Admitting: Cardiovascular Disease

## 2020-08-22 ENCOUNTER — Ambulatory Visit: Payer: 59 | Admitting: Cardiovascular Disease

## 2020-08-27 ENCOUNTER — Telehealth (INDEPENDENT_AMBULATORY_CARE_PROVIDER_SITE_OTHER): Payer: 59 | Admitting: Nurse Practitioner

## 2020-08-27 ENCOUNTER — Ambulatory Visit (INDEPENDENT_AMBULATORY_CARE_PROVIDER_SITE_OTHER): Payer: Self-pay

## 2020-08-27 ENCOUNTER — Encounter (INDEPENDENT_AMBULATORY_CARE_PROVIDER_SITE_OTHER): Payer: Self-pay | Admitting: Nurse Practitioner

## 2020-08-27 DIAGNOSIS — U071 COVID-19: Secondary | ICD-10-CM | POA: Diagnosis not present

## 2020-08-27 MED ORDER — MOLNUPIRAVIR EUA 200MG CAPSULE: 4.0000 | ORAL_CAPSULE | Freq: Two times a day (BID) | ORAL | 0 refills | Status: AC

## 2020-08-27 NOTE — Patient Instructions (Addendum)
Covid 19 Cough:   Stay well hydrated  Stay active  Deep breathing exercises  May take tylenol or fever or pain  May take mucinex twice daily  You are being prescribed MOLNUPIRAVIR for COVID-19 infection.   Please call the pharmacy or go through the drive through vs going inside if you are picking up the mediation yourself to prevent further spread. If prescribed to a [facility] affiliated pharmacy, a pharmacist will bring the medication out to your car.   ADMINISTRATION INSTRUCTIONS: Take with or without food. Swallow the tablets whole. Don't chew, crush, or break the medications because it might not work as well  For each dose of the medication, you should be taking FOUR tablets at one time, TWICE a day   Finish your full five-day course of Molnupiravir even if you feel better before you're done. Stopping this medication too early can make it less effective to prevent severe illness related to [place].    Molnupiravir is prescribed for YOU ONLY. Don't share it with others, even if they have similar symptoms as you. This medication might not be right for everyone.   Make sure to take steps to protect yourself and others while you're taking this medication in order to get well soon and to prevent others from getting sick with COVID-19.   **If you are of childbearing potential (any gender) - it is advised to not get pregnant while taking this medication and recommended that condoms are used for female partners the next 3 months after taking the medication out of extreme caution    COMMON SIDE EFFECTS: Diarrhea Nausea  Dizziness    If your COVID-19 symptoms get worse, get medical help right away. Call 911 if you experience symptoms such as worsening cough, trouble breathing, chest pain that doesn't go away, confusion, a hard time staying awake, and pale or blue-colored skin. This medication won't prevent all COVID-19 cases from getting worse.     Follow up:  Follow up if  needed

## 2020-08-27 NOTE — Telephone Encounter (Signed)
Patient called and says she tested positive for covid yesterday using a home test. She says last Tuesday she was around her family members and was notified on Friday her brother-in-law tested positive. She says the took a home test on Friday and it was negative. On Saturday she started feeling tired with a slight sore throat, so took a test on Sunday and it was positive. She says she has a cough, fatigue, runny nose, sneezing, sinus congestion, body aches without chills/fever, denies SOB, denies loss of taste/smell, denies other symptoms. I advised PCP is not in the office. She says she only wants a prescription for the antiviral and more covid tests for home use. I advised she will need to be evaluated and she could do the e-visit or virtual visit via MyChart, explained the process. She asks why does she need to do that instead of sending a message to her provider. I advised she is not in the office and not sure if she will receive it today or tomorrow when she's back in the office. She says the last time Sharyn Lull was out someone else saw her in the office. I advised I will call and see if there is another provider and if there are openings for a MyChart Video visit today. I called the office and spoke to Eldred, FC who advised Lazaro Arms, NP is available today at 1330 via MyChart, patient scheduled by Tempestt, FC. I advised the patient of the appointment, explained how to get on MyChart Video for the appointment, care advice given, patient verbalized understanding.    Reason for Disposition  [1] Continuous (nonstop) coughing interferes with work or school AND [2] no improvement using cough treatment per Care Advice  Answer Assessment - Initial Assessment Questions 1. COVID-19 DIAGNOSIS: "Who made your COVID-19 diagnosis?" "Was it confirmed by a positive lab test or self-test?" If not diagnosed by a doctor (or NP/PA), ask "Are there lots of cases (community spread) where you live?" Note: See public  health department website, if unsure.     Home test positive on yesterday 2. COVID-19 EXPOSURE: "Was there any known exposure to COVID before the symptoms began?" CDC Definition of close contact: within 6 feet (2 meters) for a total of 15 minutes or more over a 24-hour period.      Yes with my family on Tuesday, notified of a positive test on Friday 3. ONSET: "When did the COVID-19 symptoms start?"      Saturday (headache, tired) 4. WORST SYMPTOM: "What is your worst symptom?" (e.g., cough, fever, shortness of breath, muscle aches)     Cough, runny nose, nasal congestion 5. COUGH: "Do you have a cough?" If Yes, ask: "How bad is the cough?"       Comes and goes 6. FEVER: "Do you have a fever?" If Yes, ask: "What is your temperature, how was it measured, and when did it start?"     No 7. RESPIRATORY STATUS: "Describe your breathing?" (e.g., shortness of breath, wheezing, unable to speak)      No 8. BETTER-SAME-WORSE: "Are you getting better, staying the same or getting worse compared to yesterday?"  If getting worse, ask, "In what way?"     Same 9. HIGH RISK DISEASE: "Do you have any chronic medical problems?" (e.g., asthma, heart or lung disease, weak immune system, obesity, etc.)     A-fib, sleep apnea 10. VACCINE: "Have you had the COVID-19 vaccine?" If Yes, ask: "Which one, how many shots, when did you get  it?"       Yes 2 shots Moderna last year Feb 22, 2019, April 06, 2019 11. BOOSTER: "Have you received your COVID-19 booster?" If Yes, ask: "Which one and when did you get it?"       One Pfizer booster 01/02/20 12. PREGNANCY: "Is there any chance you are pregnant?" "When was your last menstrual period?"       No 13. OTHER SYMPTOMS: "Do you have any other symptoms?"  (e.g., chills, fatigue, headache, loss of smell or taste, muscle pain, sore throat)       Fatigue, headache, sore throat, body aches 14. O2 SATURATION MONITOR:  "Do you use an oxygen saturation monitor (pulse oximeter) at  home?" If Yes, ask "What is your reading (oxygen level) today?" "What is your usual oxygen saturation reading?" (e.g., 95%)       No  Protocols used: Coronavirus (COVID-19) Diagnosed or Suspected-A-AH

## 2020-08-27 NOTE — Progress Notes (Signed)
Virtual Visit via Video Note  I connected with Brittany Bauer on 08/27/20 at  1:30 PM EDT by a video enabled telemedicine application and verified that I am speaking with the correct person using two identifiers.  Location:  Patient: home Provider: office   I discussed the limitations of evaluation and management by telemedicine and the availability of in person appointments. The patient expressed understanding and agreed to proceed.  History of Present Illness:  Patient presents today through video visit for post [facility] care clinic visit.  Patient states that she was exposed to [place] on 08/21/2020.  She states that her symptoms started on 08/25/2020.  Patient tested positive for COVID through her home test on 08/27/2018.  She states that she is currently having symptoms of headache, cough, sinus drainage.  She denies any significant fever or shortness of breath.  Patient is fully vaccinated and has had boosters. Denies f/c/s, n/v/d, hemoptysis, PND, chest pain or edema.     Observations/Objective:  Vitals with BMI 08/15/2020 08/13/2020 06/05/2020  Height '5\' 7"'$  '5\' 7"'$  '5\' 7"'$   Weight 305 lbs 3 oz 306 lbs 305 lbs 6 oz  BMI 47.79 [id number]  Systolic [id number]  Diastolic 74 68 76  Pulse 86 83 81      Assessment and Plan:  Covid 19 Cough:   Stay well hydrated  Stay active  Deep breathing exercises  May take tylenol or fever or pain  May take mucinex twice daily  You are being prescribed MOLNUPIRAVIR for COVID-19 infection.   Please call the pharmacy or go through the drive through vs going inside if you are picking up the mediation yourself to prevent further spread. If prescribed to a [facility] affiliated pharmacy, a pharmacist will bring the medication out to your car.   ADMINISTRATION INSTRUCTIONS: Take with or without food. Swallow the tablets whole. Don't chew, crush, or break the medications because it might not work as well  For each dose of the medication, you should  be taking FOUR tablets at one time, TWICE a day   Finish your full five-day course of Molnupiravir even if you feel better before you're done. Stopping this medication too early can make it less effective to prevent severe illness related to [place].    Molnupiravir is prescribed for YOU ONLY. Don't share it with others, even if they have similar symptoms as you. This medication might not be right for everyone.   Make sure to take steps to protect yourself and others while you're taking this medication in order to get well soon and to prevent others from getting sick with COVID-19.   **If you are of childbearing potential (any gender) - it is advised to not get pregnant while taking this medication and recommended that condoms are used for female partners the next 3 months after taking the medication out of extreme caution    COMMON SIDE EFFECTS: Diarrhea Nausea  Dizziness    If your COVID-19 symptoms get worse, get medical help right away. Call 911 if you experience symptoms such as worsening cough, trouble breathing, chest pain that doesn't go away, confusion, a hard time staying awake, and pale or blue-colored skin. This medication won't prevent all COVID-19 cases from getting worse.     Follow up:  Follow up if needed    I discussed the assessment and treatment plan with the patient. The patient was provided an opportunity to ask questions and all were answered. The patient agreed with the plan and  demonstrated an understanding of the instructions.   The patient was advised to call back or seek an in-person evaluation if the symptoms worsen or if the condition fails to improve as anticipated.  I provided 23 minutes of non-face-to-face time during this encounter.   Fenton Foy, NP

## 2020-08-28 ENCOUNTER — Ambulatory Visit (HOSPITAL_COMMUNITY): Payer: 59 | Admitting: Physician Assistant

## 2020-09-18 ENCOUNTER — Ambulatory Visit (HOSPITAL_COMMUNITY)
Admission: RE | Admit: 2020-09-18 | Discharge: 2020-09-18 | Disposition: A | Discharge status: Home / Self Care | Payer: 59 | Source: Ambulatory Visit | Attending: Physician Assistant | Admitting: Physician Assistant

## 2020-09-18 ENCOUNTER — Other Ambulatory Visit: Payer: Self-pay

## 2020-09-18 VITALS — BP 98/64 | HR 80 | Ht 67.0 in | Wt 305.0 lb

## 2020-09-18 DIAGNOSIS — I4819 Other persistent atrial fibrillation: Secondary | ICD-10-CM | POA: Insufficient documentation

## 2020-09-18 DIAGNOSIS — Z7901 Long term (current) use of anticoagulants: Secondary | ICD-10-CM | POA: Insufficient documentation

## 2020-09-18 DIAGNOSIS — G4733 Obstructive sleep apnea (adult) (pediatric): Secondary | ICD-10-CM | POA: Diagnosis not present

## 2020-09-18 DIAGNOSIS — Z6841 Body Mass Index (BMI) 40.0 and over, adult: Secondary | ICD-10-CM | POA: Diagnosis not present

## 2020-09-18 DIAGNOSIS — E669 Obesity, unspecified: Secondary | ICD-10-CM | POA: Insufficient documentation

## 2020-09-18 DIAGNOSIS — Z880 Allergy status to penicillin: Secondary | ICD-10-CM | POA: Insufficient documentation

## 2020-09-18 DIAGNOSIS — Z79899 Other long term (current) drug therapy: Secondary | ICD-10-CM | POA: Diagnosis not present

## 2020-09-18 DIAGNOSIS — Z87891 Personal history of nicotine dependence: Secondary | ICD-10-CM | POA: Diagnosis not present

## 2020-09-18 DIAGNOSIS — I483 Typical atrial flutter: Secondary | ICD-10-CM | POA: Diagnosis not present

## 2020-09-18 DIAGNOSIS — Z713 Dietary counseling and surveillance: Secondary | ICD-10-CM | POA: Diagnosis not present

## 2020-09-18 NOTE — Progress Notes (Addendum)
Primary Care Physician: Kerin Perna, NP Primary Cardiologist: none Primary Electrophysiologist: none Referring Physician: Dr Rayne Du Kluesner is a 60 y.o. female with a history of obesity, OSA, and atrial fibrillation who presents for follow up in the [facility].  The patient was initially diagnosed with atrial fibrillation 03/19/20 incedentally during a routine colonoscopy. ECG in recovery showed afib with RVR and she was sent to the ED. Per report, she was in [place] when she presented for the colonoscopy. She was asymptomatic. Patient has a CHADS2VASC score of 1. She denies alcohol use. She has been diagnosed with severe OSA. Patient is s/p DCCV 05/14/20 which was unsuccessful at restoring SR. She was loaded on flecainide. Patient is s/p repeat DCCV on 05/29/20. She required 3 shocks to convert and was in atrial flutter on follow up. She denies feeling any better in SR.   On follow up today, patient reports she has done well since her last visit. She is using her BiPap for OSA. She remains in rate controlled afib. She denies any bleeding issues on anticoagulation.   Today, she denies symptoms of palpitations, chest pain, shortness of breath, orthopnea, PND, lower extremity edema, presyncope, syncope, bleeding, or neurologic sequela.     Atrial Fibrillation Risk Factors:  she does have symptoms or diagnosis of sleep apnea. She is compliant with BiPap therapy. she does not have a history of rheumatic fever. she does not have a history of alcohol use. The patient does not have a history of early familial atrial fibrillation or other arrhythmias.  she has a BMI of Body mass index is 47.77 kg/m.Marland Kitchen Filed Weights   09/18/20 1003  Weight: (!) 138.3 kg    Family History  Problem Relation Age of Onset   Lung cancer Mother 40       smoker   Stroke Father 47   Cancer Father        smoker-   Colon polyps Neg Hx    Colon cancer Neg Hx    Esophageal  cancer Neg Hx    Rectal cancer Neg Hx    Stomach cancer Neg Hx      Atrial Fibrillation Management history:  Previous antiarrhythmic drugs: flecainide Previous cardioversions: 04/11/20, 05/29/20 Previous ablations: none CHADS2VASC score: 1 Anticoagulation history: Eliquis, Xarelto   Past Medical History:  Diagnosis Date   Hyperlipidemia    diet controlled   Past Surgical History:  Procedure Laterality Date   CARDIOVERSION N/A 05/14/2020   Procedure: CARDIOVERSION;  Surgeon: Sueanne Margarita, MD;  Location: [facility];  Service: Cardiovascular;  Laterality: N/A;   CARDIOVERSION N/A 05/29/2020   Procedure: CARDIOVERSION;  Surgeon: [name], MD;  Location: [facility];  Service: Cardiovascular;  Laterality: N/A;   TONSILLECTOMY     TUBAL LIGATION     WISDOM TOOTH EXTRACTION      Current Outpatient Medications  Medication Sig Dispense Refill   gemfibrozil (LOPID) 600 MG tablet Take 1 tablet (600 mg total) by mouth 2 (two) times daily before a meal. 180 tablet 1   metoprolol succinate (TOPROL-XL) 50 MG 24 hr tablet Take 1 tablet (50 mg total) by mouth daily. 90 tablet 3   naproxen sodium (ALEVE) 220 MG tablet Take 220 mg by mouth as needed (mild pain).     XARELTO 20 MG TABS tablet TAKE 1 TABLET(20 MG) BY MOUTH DAILY WITH SUPPER 30 tablet 3   No current facility-administered medications for this encounter.    Allergies  Allergen Reactions   Penicillins Other (See Comments)    As a child- unknown reaction    Social History   Socioeconomic History   Marital status: Married    Spouse name: Not on file   Number of children: Not on file   Years of education: Not on file   Highest education level: Not on file  Occupational History   Not on file  Tobacco Use   Smoking status: Former    Types: Cigarettes   Smokeless tobacco: Never  Vaping Use   Vaping Use: Never used  Substance and Sexual Activity   Alcohol use: Yes    Alcohol/week: 4.0 standard drinks     Types: 2 Glasses of wine, 2 Standard drinks or equivalent per week   Drug use: Never   Sexual activity: Yes  Other Topics Concern   Not on file  Social History Narrative   Not on file   Social Determinants of Health   Financial Resource Strain: Not on file  Food Insecurity: Not on file  Transportation Needs: Not on file  Physical Activity: Not on file  Stress: Not on file  Social Connections: Not on file  Intimate Partner Violence: Not on file     ROS- All systems are reviewed and negative except as per the HPI above.  Physical Exam: Vitals:   09/18/20 1003  Weight: (!) 138.3 kg  Height: '5\' 7"'$  (1.702 m)    GEN- The patient is a well appearing obese female, alert and oriented x 3 today.   HEENT-head normocephalic, atraumatic, sclera clear, conjunctiva pink, hearing intact, trachea midline. Lungs- Clear to ausculation bilaterally, normal work of breathing Heart- irregular rate and rhythm, no murmurs, rubs or gallops  GI- soft, NT, ND, + BS Extremities- no clubbing, cyanosis, or edema MS- no significant deformity or atrophy Skin- no rash or lesion Psych- euthymic mood, full affect Neuro- strength and sensation are intact   Wt Readings from Last 3 Encounters:  09/18/20 (!) 138.3 kg  08/15/20 (!) 138.4 kg  08/13/20 (!) 138.8 kg    EKG today demonstrates  Afib Vent. rate 80 BPM PR interval * ms QRS duration 88 ms QT/QTcB 380/438 ms  Echo 04/11/20 demonstrated 1. Left ventricular ejection fraction, by estimation, is 50 to 55%. The  left ventricle has low normal function. The left ventricle has no regional wall motion abnormalities. Left ventricular diastolic function could not be evaluated.   2. Right ventricular systolic function is mildly reduced. The right  ventricular size is mildly enlarged.   3. Left atrial size was mildly dilated.   4. Right atrial size was mildly dilated.   5. The mitral valve is normal in structure. No evidence of mitral valve   regurgitation.   6. The aortic valve is grossly normal. Aortic valve regurgitation is not  visualized.   Epic records are reviewed at length today  CHA2DS2-VASc Score = 1  The patient's score is based upon: CHF History: No HTN History: No Diabetes History: No Stroke History: No Vascular Disease History: No Age Score: 0 Gender Score: 1      ASSESSMENT AND PLAN: 1. Persistent Atrial Fibrillation/typical atrial flutter The patient's CHA2DS2-VASc score is 1, indicating a 0.6% annual risk of stroke.   Patient remains in rate controlled afib today. We again discussed alternate AAD, specifically dofetilide. Information sheet given. Patient to call clinic back when/if she would like to proceed. QTc in afib today 438 ms. No ECG in SR. Continue Xarelto 20 mg daily  Continue Toprol 50 mg daily  2. Obesity Body mass index is 47.77 kg/m. Lifestyle modification was discussed and encouraged including regular physical activity and weight reduction. Patient is considering bariatric surgery. She has an appointment with her PCP to discuss. Would avoid weight loss medications with proarrhythmic potential.   3. Severe OSA Patient reports compliance with BiPap therapy. Followed by Dr Claiborne Billings.   Follow up in the AF clinic in 6 months, sooner pending decision about dofetilide admission.    [facility] [address] [place], [place] [address] [phone] 09/18/2020 10:09 AM

## 2020-09-20 ENCOUNTER — Other Ambulatory Visit: Payer: Self-pay

## 2020-09-20 ENCOUNTER — Encounter (INDEPENDENT_AMBULATORY_CARE_PROVIDER_SITE_OTHER): Payer: Self-pay | Admitting: Primary Care

## 2020-09-20 ENCOUNTER — Ambulatory Visit (INDEPENDENT_AMBULATORY_CARE_PROVIDER_SITE_OTHER): Payer: 59 | Admitting: Primary Care

## 2020-09-20 DIAGNOSIS — Z6841 Body Mass Index (BMI) 40.0 and over, adult: Secondary | ICD-10-CM

## 2020-09-20 DIAGNOSIS — Z23 Encounter for immunization: Secondary | ICD-10-CM | POA: Diagnosis not present

## 2020-09-20 NOTE — Patient Instructions (Signed)
Bariatric Surgery Information Bariatric surgery, also called weight-loss surgery, is a procedure that helps you lose weight. You may have bariatric surgery if: You have been unable to lose weight through diet and exercise. You have health problems caused by obesity, such as: Type 2 diabetes. Heart disease. Lung disease. How does bariatric surgery help me lose weight? Bariatric surgery helps you lose weight by: Decreasing how much food your body absorbs. This is done by closing off part of your stomach to make it smaller. This limits the amount of food your stomach can hold. Changing your body's regular digestive process so that food bypasses the parts of your body that absorb calories and nutrients. If you decide to have bariatric surgery, it is important to continue to eat a healthy diet and to exercise regularly after the surgery. What are the risks of bariatric surgery? As with any surgical procedure, each type of bariatric surgery has its own risks. These risks also depend on your age, your overall health, and any other medical conditions you may have. Risks of bariatric surgery can be divided into two groups. There are short-term risks and long-term risks. Short-term risks include: Infection. Bleeding. Allergic reactions to medicines or dyes. A blood clot that forms in the leg and travels to the heart or lungs. Leaking of digestive juices into the abdomen. Long-term risks and complications include: Not getting enough nutrients for your body (malnutrition). Narrowing of the digestive tract (stricture or stenosis). Diarrhea, nausea, or vomiting after eating (dumping syndrome). Failure of the device or procedure. This may require another surgery to correct the problem. When deciding on bariatric surgery, it is very important that you: Talk to your health care provider and choose the surgery that is best for you. Ask your health care provider about specific risks for the surgery you  choose. What are the different kinds of bariatric surgery? There are two kinds of bariatric surgeries: Restrictive surgery. This procedure makes your stomach smaller. It does not change your digestive process. The smaller the size of your new stomach, the less food you can eat. There are different types of restrictive surgeries. Malabsorptive surgery. This procedure makes your stomach smaller and alters your digestive process so that your body processes fewer calories and nutrients. These are the most common kind of bariatric surgery. There are different types of malabsorptive surgeries. What are the different types of restrictive surgery? Adjustable gastric banding In this procedure, an inflatable band is placed around your stomach near the upper end. This makes the passageway for food into the rest of your stomach much smaller. The band can be adjusted, making it tighter or looser, by filling it with salt solution. Your surgeon can adjust the band based on how you are feeling and how much weight you are losing. The band can be removed in the future. This requires another surgery. Sleeve gastrectomy In this procedure, your stomach is made smaller. This is done by surgically removing a large part of your stomach. When your stomach is smaller, you feel full more quickly and reduce how much you eat. What are the different types of malabsorptive surgery? Roux-en-Y gastric bypass (RGB) This is the most common weight-loss surgery. In this procedure, a small stomach pouch (gastric pouch) is created in the upper part of your stomach. Next, this gastric pouch is attached directly to the middle part of your small intestine. The farther down your small intestine the new connection is made, the fewer calories and nutrients you will absorb. This surgery  has the highest rate of complications. Biliopancreatic diversion with duodenal switch (BPD/DS) This is a multi-step procedure. First, a large part of your stomach  is removed, making your stomach smaller. Next, this smaller stomach is attached to the lower part of your small intestine. Like the RGB surgery, you absorb fewer calories and nutrients if your stomach is attached farther down the small intestine. Where to find more information American Society for Metabolic and Bariatric Surgery: www.asmbs.[employer] and [employer]estive [employer] Kidney Diseases: [employer] Summary Bariatric surgery, also called weight-loss surgery, is a procedure that helps you lose weight. This surgery may be recommended if you have been unable to lose weight through diet and exercise, or you have health problems caused by obesity, such as type 2 diabetes, heart disease, or lung disease. Generally, risks of bariatric surgery include infection, bleeding, and failure of the surgery or device. Failure of the surgery or device may require another surgery to correct the problem. This information is not intended to replace advice given to you by your health care provider. Make sure you discuss any questions you have with your health care provider. Document Revised: 04/04/2020 Document Reviewed: 04/04/2020 Elsevier Patient Education  [place].

## 2020-09-20 NOTE — Progress Notes (Signed)
  Brittany Bauer, is a 60 y.o. female  [id number]  [id number]  DOB - 06-03-1960  No chief complaint on file. (Obesity)       Subjective:   Brittany Bauer is a 60 y.o. morbid obesity female here today for weight management.  Patient was seen by cardiologist and it was suggested she needed to have significant amount of weight reduction and would benefit from bariatric surgery.  She has no headache, No chest pain, No abdominal pain - No Nausea, No new weakness tingling or numbness, No Cough - SOB.  No problems updated.  ALLERGIES: Allergies  Allergen Reactions   Penicillins Other (See Comments)    As a child- unknown reaction    PAST MEDICAL HISTORY: Past Medical History:  Diagnosis Date   Hyperlipidemia    diet controlled    MEDICATIONS AT HOME: Prior to Admission medications   Medication Sig Start Date End Date Taking? Authorizing Provider  gemfibrozil (LOPID) 600 MG tablet Take 1 tablet (600 mg total) by mouth 2 (two) times daily before a meal. 08/16/20  Yes Kerin Perna, NP  metoprolol succinate (TOPROL-XL) 50 MG 24 hr tablet Take 1 tablet (50 mg total) by mouth daily. Patient taking differently: Take 50 mg by mouth every evening. 08/15/20  Yes Troy Sine, MD  naproxen sodium (ALEVE) 220 MG tablet Take 220 mg by mouth as needed (mild pain).   Yes [provider]  XARELTO 20 MG TABS tablet TAKE 1 TABLET(20 MG) BY MOUTH DAILY WITH SUPPER 07/26/20  Yes Fenton, Clint R, PA  Review of Systems  Genitourinary:  Positive for frequency.  All other systems reviewed and are negative.   Objective:   Vitals:   09/20/20 0922  BP: 112/64  Pulse: 75  Temp: (!) 97.3 F (36.3 C)  TempSrc: Temporal  SpO2: 94%  Weight: (!) 305 lb 9.6 oz (138.6 kg)  Height: '5\' 7"'$  (1.702 m)   Exam General appearance : Awake, alert, not in any distress. Speech Clear. Not toxic looking HEENT: Atraumatic and Normocephalic, pupils equally reactive  to light and accomodation Neck: Supple, no JVD. No cervical lymphadenopathy.  Chest: Good air entry bilaterally, no added sounds  CVS: S1 S2 regular, no murmurs.  Abdomen: Bowel sounds present, Non tender and not distended with no gaurding, rigidity or rebound. Extremities: B/L Lower Ext shows no edema, both legs are warm to touch Neurology: Awake alert, and oriented X 3, CN II-XII intact, Non focal Skin: No Rash  Data Review No results found for: HGBA1C  Assessment & Plan  Diagnoses and all orders for this visit:  Morbid obesity ([place]) Morbid Obesity is > 40 indicating an excess in caloric intake or underlining conditions. This has lead to other co-morbidities. Lifestyle modifications of diet and exercise has not been effective.-     Amb Referral to Bariatric Surgery   Patient have been counseled extensively about nutrition and exercise. Other issues discussed during this visit include: low cholesterol diet, weight control and daily exercise, foot care, annual eye examinations at Ophthalmology, importance of adherence with medications and regular follow-up.  Health maintenance  Mammogram schedule 10/10/20  This note has been created with Surveyor, quantity. Any transcriptional errors are unintentional.    Kerin Perna, Np   09/20/2020, 9:31 AM

## 2020-10-10 ENCOUNTER — Other Ambulatory Visit: Payer: Self-pay

## 2020-10-10 ENCOUNTER — Ambulatory Visit
Admission: RE | Admit: 2020-10-10 | Discharge: 2020-10-10 | Disposition: A | Discharge status: Home / Self Care | Payer: 59 | Source: Ambulatory Visit | Attending: Primary Care | Admitting: Primary Care

## 2020-10-10 DIAGNOSIS — Z1231 Encounter for screening mammogram for malignant neoplasm of breast: Secondary | ICD-10-CM

## 2020-10-29 ENCOUNTER — Other Ambulatory Visit (HOSPITAL_COMMUNITY): Payer: Self-pay | Admitting: Physician Assistant

## 2020-11-02 ENCOUNTER — Ambulatory Visit (INDEPENDENT_AMBULATORY_CARE_PROVIDER_SITE_OTHER): Payer: Self-pay

## 2020-11-02 NOTE — Telephone Encounter (Signed)
Pt. Has a painful, blister-like rash to left side of her back at her bra line.No availability in the practice. Pt. Will go to [facility]. Thanks.   Reason for Disposition  [1] Pimples (localized) AND [place].Ok ] no improvement after using CARE ADVICE  Answer Assessment - Initial Assessment Questions 1. APPEARANCE of RASH: "Describe the rash."      Blistered 2. LOCATION: "Where is the rash located?"      Back - middle on left 3. NUMBER: "How many spots are there?"      Many 4. SIZE: "How big are the spots?" (Inches, centimeters or compare to size of a coin)      Small 5. ONSET: "When did the rash start?"      3 days 6. ITCHING: "Does the rash itch?" If Yes, ask: "How bad is the itch?"  (Scale 0-10; or none, mild, moderate, severe)     Mild 7. PAIN: "Does the rash hurt?" If Yes, ask: "How bad is the pain?"  (Scale 0-10; or none, mild, moderate, severe)    - NONE (0): no pain    - MILD (1-3): doesn't interfere with normal activities     - MODERATE (4-7): interferes with normal activities or awakens from sleep     - SEVERE (8-10): excruciating pain, unable to do any normal activities     Severe 8. OTHER SYMPTOMS: "Do you have any other symptoms?" (e.g., fever)     No 9. PREGNANCY: "Is there any chance you are pregnant?" "When was your last menstrual period?"     no  Protocols used: Rash or Redness - Localized-A-AH

## 2020-12-20 ENCOUNTER — Other Ambulatory Visit (INDEPENDENT_AMBULATORY_CARE_PROVIDER_SITE_OTHER): Payer: Self-pay | Admitting: Primary Care

## 2020-12-20 DIAGNOSIS — E782 Mixed hyperlipidemia: Secondary | ICD-10-CM

## 2021-01-16 ENCOUNTER — Encounter (HOSPITAL_COMMUNITY): Payer: Self-pay | Admitting: Emergency Medicine

## 2021-01-16 ENCOUNTER — Encounter (HOSPITAL_COMMUNITY): Payer: Self-pay | Admitting: *Deleted

## 2021-01-16 ENCOUNTER — Ambulatory Visit (HOSPITAL_COMMUNITY)
Admission: EM | Admit: 2021-01-16 | Discharge: 2021-01-16 | Disposition: A | Discharge status: Home / Self Care | Payer: 59

## 2021-01-16 ENCOUNTER — Other Ambulatory Visit: Payer: Self-pay

## 2021-01-16 ENCOUNTER — Emergency Department (HOSPITAL_COMMUNITY)
Admission: EM | Admit: 2021-01-16 | Discharge: 2021-01-17 | Disposition: A | Discharge status: Home / Self Care | Payer: 59 | Attending: Emergency Medicine | Admitting: Emergency Medicine

## 2021-01-16 ENCOUNTER — Encounter (INDEPENDENT_AMBULATORY_CARE_PROVIDER_SITE_OTHER): Payer: Self-pay | Admitting: Primary Care

## 2021-01-16 ENCOUNTER — Ambulatory Visit (INDEPENDENT_AMBULATORY_CARE_PROVIDER_SITE_OTHER): Payer: 59 | Admitting: Primary Care

## 2021-01-16 VITALS — BP 122/78 | HR 84 | Temp 97.3°F | Ht 66.0 in | Wt 310.4 lb

## 2021-01-16 DIAGNOSIS — M79604 Pain in right leg: Secondary | ICD-10-CM | POA: Diagnosis not present

## 2021-01-16 DIAGNOSIS — I4819 Other persistent atrial fibrillation: Secondary | ICD-10-CM | POA: Insufficient documentation

## 2021-01-16 DIAGNOSIS — Z87891 Personal history of nicotine dependence: Secondary | ICD-10-CM | POA: Diagnosis not present

## 2021-01-16 DIAGNOSIS — M7041 Prepatellar bursitis, right knee: Secondary | ICD-10-CM | POA: Diagnosis not present

## 2021-01-16 DIAGNOSIS — Z7901 Long term (current) use of anticoagulants: Secondary | ICD-10-CM | POA: Diagnosis not present

## 2021-01-16 LAB — CBC WITH DIFFERENTIAL/PLATELET
Abs Immature Granulocytes: 0.02 10*3/uL (ref 0.00–0.07)
Basophils Absolute: 0 10*3/uL (ref 0.0–0.1)
Basophils Relative: 0 %
Eosinophils Absolute: 0.1 10*3/uL (ref 0.0–0.5)
Eosinophils Relative: 1 %
HCT: 47 % — ABNORMAL HIGH (ref 36.0–46.0)
Hemoglobin: 14.7 g/dL (ref 12.0–15.0)
Immature Granulocytes: 0 %
Lymphocytes Relative: 32 %
Lymphs Abs: 2.3 10*3/uL (ref 0.7–4.0)
MCH: 27.6 pg (ref 26.0–34.0)
MCHC: 31.3 g/dL (ref 30.0–36.0)
MCV: 88.2 fL (ref 80.0–100.0)
Monocytes Absolute: 0.4 10*3/uL (ref 0.1–1.0)
Monocytes Relative: 6 %
Neutro Abs: 4.5 10*3/uL (ref 1.7–7.7)
Neutrophils Relative %: 61 %
Platelets: 188 10*3/uL (ref 150–400)
RBC: 5.33 MIL/uL — ABNORMAL HIGH (ref 3.87–5.11)
RDW: 13.7 % (ref 11.5–15.5)
WBC: 7.4 10*3/uL (ref 4.0–10.5)
nRBC: 0 % (ref 0.0–0.2)

## 2021-01-16 LAB — BASIC METABOLIC PANEL
Anion gap: 9 (ref 5–15)
BUN: 13 mg/dL (ref 8–23)
CO2: 25 mmol/L (ref 22–32)
Calcium: 9.3 mg/dL (ref 8.9–10.3)
Chloride: 105 mmol/L (ref 98–111)
Creatinine, Ser: 0.72 mg/dL (ref 0.44–1.00)
GFR, Estimated: >60 mL/min (ref >60)
Glucose, Bld: 95 mg/dL (ref 70–99)
Potassium: 4.5 mmol/L (ref 3.5–5.1)
Sodium: 139 mmol/L (ref 135–145)

## 2021-01-16 NOTE — ED Triage Notes (Signed)
Pt reports rt leg pain and swelling  started Friday.

## 2021-01-16 NOTE — ED Triage Notes (Signed)
Patient transferred from [facility] , patient reports swelling / pain at right lower leg radiating to right foot onset Friday last week , denies injury /no fever .

## 2021-01-16 NOTE — Patient Instructions (Signed)
Signs and Symptoms  Deep Vein Thrombosis Deep vein thrombosis (DVT) is a condition in which a blood clot forms in a vein of the deep venous system. This can occur in the lower leg, thigh, pelvis, arm, or neck. A clot is blood that has thickened into a gel or solid. This condition is serious and can be life-threatening if the clot travels to the arteries of the lungs and causes a blockage (pulmonary embolism). A DVT can also damage veins in the leg, which can lead to long-term venous disease, leg pain, swelling, discoloration, and ulcers or sores (post-thrombotic syndrome). What are the causes? This condition may be caused by: A slowdown of blood flow. Damage to a vein. A condition that causes blood to clot more easily, such as certain bleeding disorders. What increases the risk? The following factors may make you more likely to develop this condition: Obesity. Being older, especially older than age 43. Being inactive or not moving around (sedentary lifestyle). This may include: Sitting or lying down for longer than 4-6 hours other than to sleep at night. Being in the hospital, or having major or lengthy surgery. Having any recent bone injuries, such as breaks (fractures), that reduce movement, especially in the lower extremities. Having recent orthopedic surgery on the lower extremities. Being pregnant, giving birth, or having recently given birth. Taking medicines that contain estrogen, such as birth control or hormone replacement therapy. Using products that contain nicotine or tobacco, especially if you use hormonal birth control. Having a history of a blood vessel disease (peripheral vascular disease) or congestive heart disease. Having a history of cancer, especially if being treated with chemotherapy. What are the signs or symptoms? Symptoms of this condition include: Swelling, pain, pressure, or tenderness in an arm or a leg. An arm or a leg becoming warm, red, or discolored. A leg  turning very pale or blue. You may have a large DVT. This is rare. If the clot is in your leg, you may notice that symptoms get worse when you stand or walk. In some cases, there are no symptoms. How is this diagnosed? This condition is diagnosed with: Your medical history and a physical exam. Tests, such as: Blood tests to check how well your blood clots. Doppler ultrasound. This is the best way to find a DVT. CT venogram. Contrast dye is injected into a vein, and X-rays are taken to check for clots. This is helpful for veins in the chest or pelvis. How is this treated? Treatment for this condition depends on: The cause of your DVT. The size and location of your DVT, or having more than one DVT. Your risk for bleeding or developing more clots. Other medical conditions you may have. Treatment may include: Taking a blood thinner medicine (anticoagulant) to prevent more clots from forming or current clots from growing. Wearing compression stockings. Injecting medicines into the affected vein to break up the clot (catheter-directed thrombolysis). Surgical procedures, when DVT is severe or hard to treat. These may be done to: Isolate and remove your clot. Place an inferior vena cava (IVC) filter. This filter is placed into a large vein called the inferior vena cava to catch blood clots before they reach your lungs. You may get some medical treatments for 6 months or longer. Follow these instructions at home: If you are taking blood thinners: Talk with your health care provider before you take any medicines that contain aspirin or NSAIDs, such as ibuprofen. These medicines increase your risk for dangerous bleeding. Take  your medicine exactly as told, at the same time every day. Do not skip a dose. Do not take more than the prescribed dose. This is important. Ask your health care provider about foods and medicines that could change or interact with the way your blood thinner works. Avoid these  foods and medicines if you are told to do so. Avoid anything that may cause bleeding or bruising. You may bleed more easily while taking blood thinners. Be very careful when using knives, scissors, or other sharp objects. Use an electric razor instead of a blade. Avoid activities that could cause injury or bruising, and follow instructions for preventing falls. Tell your health care provider if you have had any internal bleeding, bleeding ulcers, or neurologic diseases, such as strokes or cerebral aneurysms. Wear a medical alert bracelet or carry a card that lists what medicines you take. General instructions Take over-the-counter and prescription medicines only as told by your health care provider. Return to your normal activities as told by your health care provider. Ask your health care provider what activities are safe for you. If recommended, wear compression stockings as told by your health care provider. These stockings help to prevent blood clots and reduce swelling in your legs. Never wear your compression stockings while sleeping at night. Keep all follow-up visits. This is important. Where to find more information American Heart Association: www.heart.org Centers for Disease Control and Prevention: http://www.wolf.info/ National Heart, Lung, and Blood Institute: https://wilson-eaton.com/ Contact a health care provider if: You miss a dose of your blood thinner. You have unusual bruising or other color changes. You have new or worse pain, swelling, or redness in an arm or a leg. You have worsening numbness or tingling in an arm or a leg. You have a significant color change (pale or blue) in the extremity that has the DVT. Get help right away if: You have signs or symptoms that a blood clot has moved to the lungs. These may include: Shortness of breath. Chest pain. Fast or irregular heartbeats (palpitations). Light-headedness, dizziness, or fainting. Coughing up blood. You have signs or symptoms  that your blood is too thin. These may include: Blood in your vomit, stool, or urine. A cut that will not stop bleeding. A menstrual period that is heavier than usual. A severe headache or confusion. These symptoms may be an emergency. Get help right away. Call 911. Do not wait to see if the symptoms will go away. Do not drive yourself to the hospital. Summary Deep vein thrombosis (DVT) happens when a blood clot forms in a deep vein. This may occur in the lower leg, thigh, pelvis, arm, or neck. Symptoms affect the arm or leg and can include swelling, pain, tenderness, warmth, redness, or discoloration. This condition may be treated with medicines. In severe cases, a procedure or surgery may be done to remove or dissolve the clots. If you are taking blood thinners, take them exactly as told. Do not skip a dose. Do not take more than is prescribed. Get help right away if you have a severe headache, shortness of breath, chest pain, fast or irregular heartbeats, or blood in your vomit, urine, or stool. This information is not intended to replace advice given to you by your health care provider. Make sure you discuss any questions you have with your health care provider. Document Revised: 07/30/2020 Document Reviewed: 07/30/2020 Elsevier Patient Education  [place] Bursitis Prepatellar bursitis is inflammation of the prepatellar bursa, which is a fluid-filled sac  that cushions the kneecap (patella). Prepatellar bursitis happens when fluid builds up in this sac and causes it to swell. The condition causes knee pain. What are the causes? This condition may be caused by: Constant pressure on the knees from kneeling. A hit to the knee. Falling on the knee. Infection from bacteria. Moving the knee often in a forceful way. What increases the risk? You are more likely to develop this condition if: You play sports that have a high risk of falling on the knee or being hit on the  knee. These include football, wrestling, basketball, or soccer. You do work in which you kneel for long periods of time, such as roofing, plumbing, or gardening. You have another inflammatory condition, such as gout or rheumatoid arthritis. What are the signs or symptoms? The most common symptom of this condition is knee pain that gets better with rest. Other symptoms include: Swelling on the front of the kneecap. Warmth in the knee. Tenderness with activity. Redness in the knee. Inability to bend the knee or to kneel. How is this diagnosed? This condition is diagnosed based on: A physical exam. Your health care provider will compare your knees and check for tenderness and pain while moving your knee. Your medical history. Tests to check for infection. These may include blood tests and tests on the fluid in the bursa. Imaging tests, such as X-ray, MRI, or ultrasound, to check for damage in the patella, or fluid buildup and swelling in the bursa. How is this treated? This condition may be treated by: Resting the knee. Putting ice on the knee. Taking medicines, such as: NSAIDs. These can help to reduce pain and swelling. Antibiotics. These may be needed if you have an infection. Steroids. These are used to reduce swelling and inflammation, and may be prescribed if other treatments are not helping. Raising (elevating) the knee while resting. Doing exercises to help you maintain movement (physical therapy). These may be recommended after pain and swelling improve. Having a procedure to remove fluid from the bursa. This may be done if other treatments are not helping. Having surgery to remove the bursa. This may be done if you have a severe infection or if the condition keeps coming back after treatment. Follow these instructions at home: Medicines Take over-the-counter and prescription medicines only as told by your health care provider. If you were prescribed an antibiotic medicine, take  it as told by your health care provider. Do not stop taking the antibiotic even if you start to feel better. Managing pain, stiffness, and swelling  If directed, put ice on the injured area. Put ice in a plastic bag. Place a towel between your skin and the bag. Leave the ice on for 20 minutes, 2-3 times a day. Elevate the injured area above the level of your heart while you are sitting or lying down. Activity Do not use the injured limb to support your body weight until your health care provider says that you can. Rest your knee. Avoid activities that cause knee pain. Return to your normal activities as told by your health care provider. Ask your health care provider what activities are safe for you. Do exercises as told by your health care provider. General instructions Ask your health care provider when it is safe for you to drive. Do not use any products that contain nicotine or tobacco, such as cigarettes, e-cigarettes, and chewing tobacco. These can delay healing. If you need help quitting, ask your health care provider. Keep all  follow-up visits as told by your health care provider. This is important. How is this prevented? Warm up and stretch before being active. Cool down and stretch after being active. Give your body time to rest between periods of activity. Maintain physical fitness, including strength and flexibility. Be safe and responsible while being active. This will help you to avoid falls. Wear knee pads if you have to kneel for a long period of time. Contact a health care provider if: Your symptoms do not improve or get worse. Your symptoms keep coming back after treatment. You develop a fever and have warmth, redness, or swelling over your knee. Summary Prepatellar bursitis is inflammation of the prepatellar bursa, which is a fluid-filled sac that cushions the kneecap (patella). This condition may be caused by injury or constant pressure on the knee. It may also be  caused by an infection from bacteria. Symptoms of this condition include pain, swelling, warmth, and tenderness in the knee. Follow instructions from your health care provider about taking medicines, resting, and doing activities. Contact your health care provider if your symptoms do not improve, get worse, or keep coming back after treatment. This information is not intended to replace advice given to you by your health care provider. Make sure you discuss any questions you have with your health care provider. Document Revised: 04/30/2018 Document Reviewed: 03/18/2018 Elsevier Patient Education  [place].

## 2021-01-16 NOTE — Progress Notes (Addendum)
Acute Office Visit  Subjective:    Patient ID: Brittany Bauer, female    DOB: 04-Feb-1960, 60 y.o.   MRN: [id number]  Chief Complaint  Patient presents with   Leg Pain    HPI Brittany Bauer is a 60 year old morbid obese female that has been having right leg pain from her knee to calf pain and  ankle x 5 days.  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  She had Prepatellar bursitis of right knee and would need further evaluation at urgent care. Past Medical History:  Diagnosis Date   Hyperlipidemia    diet controlled    Past Surgical History:  Procedure Laterality Date   BREAST EXCISIONAL BIOPSY Right    over 10 years ago   CARDIOVERSION N/A 05/14/2020   Procedure: CARDIOVERSION;  Surgeon: Sueanne Margarita, MD;  [facility] ENDOSCOPY;  Service: Cardiovascular;  Laterality: N/A;   CARDIOVERSION N/A 05/29/2020   Procedure: CARDIOVERSION;  Surgeon: [name], MD;  Location: [facility] ENDOSCOPY;  Service: Cardiovascular;  Laterality: N/A;   TONSILLECTOMY     TUBAL LIGATION     WISDOM TOOTH EXTRACTION      Family History  Problem Relation Age of Onset   Lung cancer Mother 25       smoker   Stroke Father 27   Cancer Father        smoker-   Colon polyps Neg Hx    Colon cancer Neg Hx    Esophageal cancer Neg Hx    Rectal cancer Neg Hx    Stomach cancer Neg Hx    Breast cancer Neg Hx     Social History   Socioeconomic History   Marital status: Married    Spouse name: Not on file   Number of children: Not on file   Years of education: Not on file   Highest education level: Not on file  Occupational History   Not on file  Tobacco Use   Smoking status: Former    Types: Cigarettes   Smokeless tobacco: Never  Vaping Use   Vaping Use: Never used  Substance and Sexual Activity   Alcohol use: Yes    Alcohol/week: 4.0 standard drinks    Types: 2 Glasses of wine, 2 Standard drinks or equivalent per week    Drug use: Never   Sexual activity: Yes  Other Topics Concern   Not on file  Social History Narrative   Not on file   Social Determinants of Health   Financial Resource Strain: Not on file  Food Insecurity: Not on file  Transportation Needs: Not on file  Physical Activity: Not on file  Stress: Not on file  Social Connections: Not on file  Intimate Partner Violence: Not on file    Outpatient Medications Prior to Visit  Medication Sig Dispense Refill   gemfibrozil (LOPID) 600 MG tablet Take 1 tablet (600 mg total) by mouth 2 (two) times daily before a meal. 180 tablet 1   metoprolol succinate (TOPROL-XL) 50 MG 24 hr tablet Take 1 tablet (50 mg total) by mouth daily. (Patient taking differently: Take 50 mg by mouth every evening.) 90 tablet 3   naproxen sodium (ALEVE) 220 MG tablet Take 220 mg by mouth as needed (mild pain).     XARELTO 20 MG TABS tablet TAKE 1 TABLET(20 MG) BY MOUTH DAILY WITH SUPPER 30 tablet 11   No facility-administered medications prior  to visit.    Allergies  Allergen Reactions   Penicillins Other (See Comments)    As a child- unknown reaction    Review of Systems Comprehensive review of system pertinent positives and negatives noted in HPI    Objective:    Physical Exam Physical exam: General: Vital signs reviewed.  Patient is well-developed and well-nourished, severe morbid obese in no acute distress and cooperative with exam. Head: Normocephalic and atraumatic. Eyes: EOMI, conjunctivae normal, no scleral icterus. Neck: Supple, trachea midline, normal ROM, no JVD, masses, thyromegaly, or carotid bruit present. Cardiovascular: RRR, S1 normal, S2 normal, no murmurs, gallops, or rubs. Pulmonary/Chest: Clear to auscultation bilaterally, no wheezes, rales, or rhonchi. Abdominal: Soft, non-tender, non-distended, BS +, no masses, organomegaly, or guarding present. Musculoskeletal: No joint deformities, erythema, or stiffness, ROM full and  nontender. Extremities: No lower extremity edema bilaterally,  pulses symmetric and intact bilaterally. No cyanosis or clubbing. Neurological: A&O x3, Strength is normal Skin: Warm, dry and intact. No rashes or erythema. Psychiatric: Normal mood and affect. speech and behavior is normal. Cognition and memory are normal.    BP 122/78 (BP Location: Right Arm, Patient Position: Sitting, Cuff Size: Large)    Pulse 84    Temp (!) 97.3 F (36.3 C) (Temporal)    Ht _0  (1.676 m)    Wt (!) 310 lb 6.4 oz (140.8 kg)    SpO2 95%    BMI 50.10 kg/m  Wt Readings from Last 3 Encounters:  01/16/21 (!) 310 lb 6.4 oz (140.8 kg)  09/20/20 (!) 305 lb 9.6 oz (138.6 kg)  09/18/20 (!) 305 lb (138.3 kg)    Health Maintenance Due  Topic Date Due   HIV Screening  Never done   Hepatitis C Screening  Never done   COVID-19 Vaccine (4 - Booster) 02/27/2020    There are no preventive care reminders to display for this patient.   Lab Results  Component Value Date   TSH 4.330 01/12/2020   Lab Results  Component Value Date   WBC 6.3 08/13/2020   HGB 15.0 08/13/2020   HCT 47.4 (H) 08/13/2020   MCV 86 08/13/2020   PLT 200 08/13/2020   Lab Results  Component Value Date   NA 141 08/13/2020   K 5.1 08/13/2020   CO2 25 08/13/2020   GLUCOSE 100 (H) 08/13/2020   BUN 17 08/13/2020   CREATININE 0.88 08/13/2020   BILITOT 0.8 08/13/2020   ALKPHOS 95 08/13/2020   AST 19 08/13/2020   ALT 19 08/13/2020   PROT 6.5 08/13/2020   ALBUMIN 4.2 08/13/2020   CALCIUM 9.6 08/13/2020   ANIONGAP 6 05/21/2020   EGFR 75 08/13/2020   Lab Results  Component Value Date   CHOL 139 08/13/2020   Lab Results  Component Value Date   HDL 41 08/13/2020   Lab Results  Component Value Date   LDLCALC 70 08/13/2020   Lab Results  Component Value Date   TRIG 162 (H) 08/13/2020   Lab Results  Component Value Date   CHOLHDL 3.4 08/13/2020   No results found for: HGBA1C     Assessment & Plan:  Raynie was seen today  for leg pain.  Diagnoses and all orders for this visit:  Prepatellar bursitis of right knee   Information given for home treatment on AVS for bursitis and information on DVT. Advised to be seen at Urgent care.   Kerin Perna

## 2021-01-16 NOTE — Discharge Instructions (Signed)
Please go to the ER to have an ultrasound done since we dont have one here.

## 2021-01-16 NOTE — ED Triage Notes (Signed)
Pt was called in front lobby with no answer. Pt was called on mobile and there was no answer.

## 2021-01-16 NOTE — Progress Notes (Signed)
5 days cannot bend straight because of stabbing pain. Need to talk about medication also

## 2021-01-16 NOTE — ED Provider Notes (Signed)
[place]    CSN: [id number] Arrival date & time: 01/16/21  1713      History   Chief Complaint Chief Complaint  Patient presents with   Leg Pain   Leg Swelling    HPI Brittany Bauer is a 60 y.o. female who presents due to having R calf pain and pain from dorsal foot to anterior ankle x 5 days. She saw her PCP who told her to come here for evaluation and to r/o DVT. She was told we dont have [place] here to r/o that out, but she still wanted to be evaluated here. Her pain is worse when she tries to dorsiflex her foot. 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 She denies trauma or injury of this leg. Denies long flights or car rides in the past month.     Past Medical History:  Diagnosis Date   Hyperlipidemia    diet controlled    Patient Active Problem List   Diagnosis Date Noted   Typical atrial flutter ([place]) 06/05/2020   Persistent atrial fibrillation ([place]) 03/21/2020   Class 3 severe obesity due to excess calories with body mass index (BMI) of 45.0 to 49.9 in adult [facility]) 02/13/2020    Past Surgical History:  Procedure Laterality Date   BREAST EXCISIONAL BIOPSY Right    over 10 years ago   CARDIOVERSION N/A 05/14/2020   Procedure: CARDIOVERSION;  Surgeon: Sueanne Margarita, MD;  Location: [facility] ENDOSCOPY;  Service: Cardiovascular;  Laterality: N/A;   CARDIOVERSION N/A 05/29/2020   Procedure: CARDIOVERSION;  Surgeon: [name], MD;  Location: [place];  Service: Cardiovascular;  Laterality: N/A;   TONSILLECTOMY     TUBAL LIGATION     WISDOM TOOTH EXTRACTION      OB History   No obstetric history on file.      Home Medications    Prior to Admission medications   Medication Sig Start Date End Date Taking? Authorizing Provider  gemfibrozil (LOPID) 600 MG tablet Take 1 tablet (600 mg total) by mouth 2 (two) times daily before a meal. 08/16/20   Kerin Perna, NP  metoprolol succinate (TOPROL-XL) 50 MG 24 hr  tablet Take 1 tablet (50 mg total) by mouth daily. Patient taking differently: Take 50 mg by mouth every evening. 08/15/20   Troy Sine, MD  naproxen sodium (ALEVE) 220 MG tablet Take 220 mg by mouth as needed (mild pain).    [provider]  XARELTO 20 MG TABS tablet TAKE 1 TABLET(20 MG) BY MOUTH DAILY WITH SUPPER 10/29/20   Fenton, Stayton R, PA    Family History Family History  Problem Relation Age of Onset   Lung cancer Mother 59       smoker   Stroke Father 69   Cancer Father        smoker-   Colon polyps Neg Hx    Colon cancer Neg Hx    Esophageal cancer Neg Hx    Rectal cancer Neg Hx    Stomach cancer Neg Hx    Breast cancer Neg Hx     Social History Social History   Tobacco Use   Smoking status: Former    Types: Cigarettes   Smokeless tobacco: Never  Vaping Use   Vaping Use: Never used  Substance Use Topics   Alcohol use: Yes    Alcohol/week: 4.0 standard drinks    Types: 2 Glasses of wine, 2 Standard drinks  or equivalent per week   Drug use: Never     Allergies   Penicillins   Review of Systems Review of Systems  Musculoskeletal:  Positive for gait problem and myalgias.       R calf  Skin:  Negative for rash and wound.    Physical Exam Triage Vital Signs ED Triage Vitals  Enc Vitals Group     BP 01/16/21 1912 121/90     Pulse Rate 01/16/21 1912 92     Resp 01/16/21 1912 20     Temp 01/16/21 1912 98.1 F (36.7 C)     Temp src --      SpO2 01/16/21 1912 99 %     Weight --      Height --      Head Circumference --      Peak Flow --      Pain Score 01/16/21 1910 8     Pain Loc --      Pain Edu? --      Excl. in [place]? --    No data found.  Updated Vital Signs BP 121/90    Pulse 92    Temp 98.1 F (36.7 C)    Resp 20    SpO2 99%   Visual Acuity Right Eye Distance:   Left Eye Distance:   Bilateral Distance:    Right Eye Near:   Left Eye Near:    Bilateral Near:     Physical Exam Vitals and nursing note reviewed.   Constitutional:      General: She is not in acute distress.    Appearance: She is obese. She is not toxic-appearing.  HENT:     Right Ear: External ear normal.     Left Ear: External ear normal.  Eyes:     General: No scleral icterus.    Conjunctiva/sclera: Conjunctivae normal.  Pulmonary:     Effort: Pulmonary effort is normal.  Musculoskeletal:     Cervical back: Neck supple.     Comments: R LE- there is mild swelling of her R calf up on inspection, has local tenderness on lateral R mid calf where there is slight lump and this area is tender as well as the lower calf area. I do not feel any cords on her calf. Dorsiflexion of her foot provoked more pain. She could not walk on her tip toes, and just a little on her heel. No redness noted. She does have a lot of varicosities. She also has tenderness on the posterior distal thigh area, no masses or cords or redness noted. Pedal pulses are +2/4  Skin:    General: Skin is warm and dry.     Findings: No rash.  Neurological:     Mental Status: She is alert and oriented to person, place, and time.     Gait: Gait abnormal.  Psychiatric:        Mood and Affect: Mood normal.        Behavior: Behavior normal.        Thought Content: Thought content normal.        Judgment: Judgment normal.     UC Treatments / Results  Labs (all labs ordered are listed, but only abnormal results are displayed) Labs Reviewed - No data to display  EKG   Radiology No results found.  Procedures Procedures (including critical care time)  Medications Ordered in UC Medications - No data to display  Initial Impression / Assessment and Plan / UC Course  I have reviewed the triage vital signs and the nursing notes. R calf pain Sent to ER for further evaluation and [place]. Pt was explained she would have to be evaluated first.      Final Clinical Impressions(s) / UC Diagnoses   Final diagnoses:  Pain of right lower extremity     Discharge  Instructions      Please go to the ER to have an ultrasound done since we dont have one here.     ED Prescriptions   None    PDMP not reviewed this encounter.   Shelby Mattocks, [place] 01/16/21 1938

## 2021-01-17 ENCOUNTER — Emergency Department (HOSPITAL_COMMUNITY): Payer: 59

## 2021-01-17 ENCOUNTER — Emergency Department (HOSPITAL_BASED_OUTPATIENT_CLINIC_OR_DEPARTMENT_OTHER): Payer: 59

## 2021-01-17 DIAGNOSIS — M79604 Pain in right leg: Secondary | ICD-10-CM

## 2021-01-17 MED ORDER — HYDROCODONE-ACETAMINOPHEN 5-325 MG PO TABS: 1.0000 | ORAL_TABLET | ORAL | 0 refills | Status: DC | PRN

## 2021-01-17 MED ORDER — OXYCODONE-ACETAMINOPHEN 5-325 MG PO TABS
1.0000 | ORAL_TABLET | Freq: Once | ORAL | Status: AC
Start: 2021-01-17 — End: 2021-01-17
  Administered 2021-01-17: 09:00:00 1 via ORAL
  Filled 2021-01-17: qty 1

## 2021-01-17 NOTE — Discharge Instructions (Signed)
You can use Voltaren gel to the area for pain control.  Follow-up with an orthopedist as discussed.  Return to emergency room if you have any worsening symptoms.

## 2021-01-17 NOTE — Progress Notes (Signed)
VASCULAR LAB    Right lower extremity venous duplex has been performed.  See CV proc for preliminary results.  Messaged results to Dr. Tamera Punt via secure chat  Sharion Dove, RVT 01/17/2021, 10:15 AM

## 2021-01-17 NOTE — ED Notes (Signed)
Patient transported to X-ray 

## 2021-01-17 NOTE — ED Provider Notes (Signed)
[facility] EMERGENCY DEPARTMENT Provider Note   CSN: [id number] Arrival date & time: 01/16/21  1942     History Chief Complaint  Patient presents with   Right Lower Leg Swelling    Brittany Bauer is a 60 y.o. female.  Patient is a 60 year old female who presents with right leg pain.  She does have a history of atrial fibrillation and is on Xarelto.  She reports a 2-day history of pain to her right leg.  She says it starts in her foot and radiates up her leg to behind her knee.  Its both on the front and the back.  It is worse when she puts weight on her leg.  She denies any known injuries.  She feels like it may be a little bit more swollen than the other side but cannot tell for sure.  No known fevers.  Her PCP saw her and sent her to urgent care.  However urgent care is unable to evaluate for DVT and she was sent here to the emergency room.  She denies any chest pain or shortness of breath.  No cough or fevers.      Past Medical History:  Diagnosis Date   Hyperlipidemia    diet controlled    Patient Active Problem List   Diagnosis Date Noted   Typical atrial flutter ([place]) 06/05/2020   Persistent atrial fibrillation ([place]) 03/21/2020   Class 3 severe obesity due to excess calories with body mass index (BMI) of 45.0 to 49.9 in adult [facility]) 02/13/2020    Past Surgical History:  Procedure Laterality Date   BREAST EXCISIONAL BIOPSY Right    over 10 years ago   CARDIOVERSION N/A 05/14/2020   Procedure: CARDIOVERSION;  Surgeon: Sueanne Margarita, MD;  Location: [facility] ENDOSCOPY;  Service: Cardiovascular;  Laterality: N/A;   CARDIOVERSION N/A 05/29/2020   Procedure: CARDIOVERSION;  Surgeon: [name], MD;  Location: [place];  Service: Cardiovascular;  Laterality: N/A;   TONSILLECTOMY     TUBAL LIGATION     WISDOM TOOTH EXTRACTION       OB History   No obstetric history on file.     Family History  Problem Relation Age of Onset   Lung cancer  Mother 61       smoker   Stroke Father 25   Cancer Father        smoker-   Colon polyps Neg Hx    Colon cancer Neg Hx    Esophageal cancer Neg Hx    Rectal cancer Neg Hx    Stomach cancer Neg Hx    Breast cancer Neg Hx     Social History   Tobacco Use   Smoking status: Former    Types: Cigarettes   Smokeless tobacco: Never  Vaping Use   Vaping Use: Never used  Substance Use Topics   Alcohol use: Yes    Alcohol/week: 4.0 standard drinks    Types: 2 Glasses of wine, 2 Standard drinks or equivalent per week   Drug use: Never    Home Medications Prior to Admission medications   Medication Sig Start Date End Date Taking? Authorizing Provider  HYDROcodone-acetaminophen (NORCO/VICODIN) 5-325 MG tablet Take 1 tablet by mouth every 4 (four) hours as needed. 01/17/21  Yes Malvin Johns, MD  gemfibrozil (LOPID) 600 MG tablet Take 1 tablet (600 mg total) by mouth 2 (two) times daily before a meal. 08/16/20   Kerin Perna, NP  metoprolol succinate (TOPROL-XL) 50 MG 24 hr  tablet Take 1 tablet (50 mg total) by mouth daily. Patient taking differently: Take 50 mg by mouth every evening. 08/15/20   Troy Sine, MD  naproxen sodium (ALEVE) 220 MG tablet Take 220 mg by mouth as needed (mild pain).    [provider]  XARELTO 20 MG TABS tablet TAKE 1 TABLET(20 MG) BY MOUTH DAILY WITH SUPPER 10/29/20   Fenton, Clint R, PA    Allergies    Penicillins  Review of Systems   Review of Systems  Constitutional:  Negative for chills, diaphoresis, fatigue and fever.  HENT:  Negative for congestion, rhinorrhea and sneezing.   Eyes: Negative.   Respiratory:  Negative for cough, chest tightness and shortness of breath.   Cardiovascular:  Negative for chest pain and leg swelling.  Gastrointestinal:  Negative for abdominal pain, blood in stool, diarrhea, nausea and vomiting.  Genitourinary:  Negative for difficulty urinating, flank pain, frequency and hematuria.  Musculoskeletal:   Positive for arthralgias and myalgias. Negative for back pain.  Skin:  Negative for rash.  Neurological:  Negative for dizziness, speech difficulty, weakness, numbness and headaches.   Physical Exam Updated Vital Signs BP 132/85    Pulse 95    Temp 97.8 F (36.6 C) (Oral)    Resp 17    Ht 5\' 6"  (1.676 m)    Wt (!) 151 kg    SpO2 98%    BMI 53.73 kg/m   Physical Exam Constitutional:      Appearance: She is well-developed.  HENT:     Head: Normocephalic and atraumatic.  Eyes:     Pupils: Pupils are equal, round, and reactive to light.  Cardiovascular:     Rate and Rhythm: Normal rate and regular rhythm.     Heart sounds: Normal heart sounds.  Pulmonary:     Effort: Pulmonary effort is normal. No respiratory distress.     Breath sounds: Normal breath sounds. No wheezing or rales.  Chest:     Chest wall: No tenderness.  Abdominal:     General: Bowel sounds are normal.     Palpations: Abdomen is soft.     Tenderness: There is no abdominal tenderness. There is no guarding or rebound.  Musculoskeletal:        General: Normal range of motion.     Cervical back: Normal range of motion and neck supple.     Comments: Patient has tenderness to the right foot, mostly over the calcaneus.  No swelling or deformities noted.  No pain to the ankle.  There is some tenderness to the posterior calf and the posterior knee.  There is some tenderness on palpation of the medial and lateral aspects of the knee.  No deformity.  No ligament instability.  She is able do a straight leg raise.  She has normal sensation and motor function distally.  Pedal pulses are intact.  No color changes compared to the other leg.  No warmth or erythema.  No obvious swelling in the right leg as compared to the left.  Lymphadenopathy:     Cervical: No cervical adenopathy.  Skin:    General: Skin is warm and dry.     Findings: No rash.  Neurological:     Mental Status: She is alert and oriented to person, place, and time.     ED Results / Procedures / Treatments   Labs (all labs ordered are listed, but only abnormal results are displayed) Labs Reviewed  CBC WITH DIFFERENTIAL/PLATELET - Abnormal; Notable  for the following components:      Result Value   RBC 5.33 (*)    HCT 47.0 (*)    All other components within normal limits  BASIC METABOLIC PANEL    EKG None  Radiology DG Knee Complete 4 Views Right  Result Date: 01/17/2021 CLINICAL DATA:  Right lower leg pain EXAM: RIGHT KNEE - COMPLETE 4+ VIEW COMPARISON:  None. FINDINGS: Alignment is anatomic. No acute fracture. No definite joint effusion. Joint spaces are preserved. IMPRESSION: Negative. Electronically Signed   By: Macy Mis M.D.   On: 01/17/2021 09:02   DG Foot Complete Right  Result Date: 01/17/2021 CLINICAL DATA:  Right lower leg pain, foot pain, progressive over 5 days EXAM: RIGHT FOOT COMPLETE - 3+ VIEW COMPARISON:  None. FINDINGS: There is no acute fracture or dislocation. There is no erosive or destructive change. Alignment is normal. The Lisfranc and Chopart joints are intact. There is Achilles enthesopathy and mild inferior calcaneal spurring. The soft tissues are unremarkable. IMPRESSION: No acute finding in the foot. Electronically Signed   By: Valetta Mole M.D.   On: 01/17/2021 09:01    Procedures Procedures   Medications Ordered in ED Medications  oxyCODONE-acetaminophen (PERCOCET/ROXICET) 5-325 MG per tablet 1 tablet (1 tablet Oral Given 01/17/21 3557)    ED Course  I have reviewed the triage vital signs and the nursing notes.  Pertinent labs & imaging results that were available during my care of the patient were reviewed by me and considered in my medical decision making (see chart for details).    MDM Rules/Calculators/A&P                         Patient is a 60 year old female who presents with right leg pain.  There is no significant swelling.  Ultrasound shows no evidence of DVT.  This was relayed to me by the  tech.  X-rays of her knee and foot show no acute abnormalities these were reviewed by me.  She sounds to have good perfusion in the foot.  No evidence of infection.  She seems to have mostly pain radiating from the heel.  This could represent some plantar fasciitis.  Otherwise likely muscular.  She was discharged home in good condition.  Will give referral to orthopedics.  We will give short-term prescription for Vicodin.  She was advised on ice and elevation.  She was advised not to use ibuprofen while she is on the Xarelto but was advised that she can use Voltaren gel.  Return precautions were given.    Final Clinical Impression(s) / ED Diagnoses Final diagnoses:  Pain of right lower extremity    Rx / DC Orders ED Discharge Orders          Ordered    HYDROcodone-acetaminophen (NORCO/VICODIN) 5-325 MG tablet  Every 4 hours PRN        01/17/21 1039             Malvin Johns, MD 01/17/21 1041

## 2021-01-17 NOTE — ED Notes (Signed)
Pt ambulatory to waiting room. Pt verbalized understanding of discharge instructions.   

## 2021-01-18 ENCOUNTER — Ambulatory Visit (INDEPENDENT_AMBULATORY_CARE_PROVIDER_SITE_OTHER): Payer: 59 | Admitting: Primary Care

## 2021-01-30 ENCOUNTER — Ambulatory Visit: Payer: 59 | Admitting: Physician Assistant

## 2021-02-18 ENCOUNTER — Other Ambulatory Visit (INDEPENDENT_AMBULATORY_CARE_PROVIDER_SITE_OTHER): Payer: Self-pay | Admitting: Primary Care

## 2021-02-18 DIAGNOSIS — E782 Mixed hyperlipidemia: Secondary | ICD-10-CM

## 2021-02-18 NOTE — Telephone Encounter (Signed)
Request routed to PCP ?

## 2021-03-26 ENCOUNTER — Other Ambulatory Visit: Payer: Self-pay | Admitting: Orthopedic Surgery

## 2021-03-27 ENCOUNTER — Other Ambulatory Visit: Payer: Self-pay | Admitting: Cardiovascular Disease

## 2021-03-28 ENCOUNTER — Other Ambulatory Visit: Payer: Self-pay

## 2021-03-28 ENCOUNTER — Encounter
Admission: RE | Admit: 2021-03-28 | Discharge: 2021-03-28 | Disposition: A | Discharge status: Home / Self Care | Payer: 59 | Source: Ambulatory Visit | Attending: Orthopedic Surgery | Admitting: Orthopedic Surgery

## 2021-03-28 ENCOUNTER — Telehealth: Payer: Self-pay

## 2021-03-28 DIAGNOSIS — I4891 Unspecified atrial fibrillation: Secondary | ICD-10-CM | POA: Insufficient documentation

## 2021-03-28 DIAGNOSIS — Z01810 Encounter for preprocedural cardiovascular examination: Secondary | ICD-10-CM | POA: Insufficient documentation

## 2021-03-28 HISTORY — DX: Obstructive sleep apnea (adult) (pediatric): G47.33

## 2021-03-28 HISTORY — DX: Unspecified atrial fibrillation: I48.91

## 2021-03-28 HISTORY — DX: Morbid (severe) obesity due to excess calories: E66.01

## 2021-03-28 NOTE — Patient Instructions (Addendum)
Your procedure is scheduled on: Monday, March 13 ?Report to the Registration Desk on the 1st floor of the [facility]. ?To find out your arrival time, please call [phone] between 1PM - 3PM on: Friday, March 10 ? ?REMEMBER: ?Instructions that are not followed completely may result in serious medical risk, up to and including death; or upon the discretion of your surgeon and anesthesiologist your surgery may need to be rescheduled. ? ?Do not eat food after midnight the night before surgery.  ?No gum chewing, lozengers or hard candies. ? ?You may however, drink CLEAR liquids up to 2 hours before you are scheduled to arrive for your surgery. Do not drink anything within 2 hours of your scheduled arrival time. ? ?Clear liquids include: ?- water  ?- apple juice without pulp ?- gatorade (not RED colors) ?- black coffee or tea (Do NOT add milk or creamers to the coffee or tea) ?Do NOT drink anything that is not on this list. ? ?In addition, your doctor has ordered for you to drink the provided  ?Ensure Pre-Surgery Clear Carbohydrate Drink  ?Drinking this carbohydrate drink up to two hours before surgery helps to reduce insulin resistance and improve patient outcomes. Please complete drinking 2 hours prior to scheduled arrival time. ? ?TAKE THESE MEDICATIONS THE MORNING OF SURGERY WITH A SIP OF WATER: ? ?Gemfibrozil (Lopid) ?Tramadol if needed for pain ? ?Follow recommendations from Cardiologist, Pulmonologist or PCP regarding stopping Xarelto. Usually hold xarelto for 3 days prior to surgery. ? ?One week prior to surgery: ?Stop Anti-inflammatories (NSAIDS) such as Advil, Aleve, Ibuprofen, Motrin, Naproxen, Naprosyn and Aspirin based products such as Excedrin, Goodys Powder, BC Powder. ?Stop ANY OVER THE COUNTER supplements until after surgery. ?You may however, continue to take Tylenol if needed for pain up until the day of surgery. ? ?No Alcohol for 24 hours before or after surgery. ? ?No Smoking including  e-cigarettes for 24 hours prior to surgery.  ?No chewable tobacco products for at least 6 hours prior to surgery.  ?No nicotine patches on the day of surgery. ? ?Do not use any "recreational" drugs for at least a week prior to your surgery.  ?Please be advised that the combination of cocaine and anesthesia may have negative outcomes, up to and including death. ?If you test positive for cocaine, your surgery will be cancelled. ? ?On the morning of surgery brush your teeth with toothpaste and water, you may rinse your mouth with mouthwash if you wish. ?Do not swallow any toothpaste or mouthwash. ? ?Use CHG Soap as directed on instruction sheet. ? ?Do not wear jewelry, make-up, hairpins, clips or nail polish. ? ?Do not wear lotions, powders, or perfumes.  ? ?Do not shave body from the neck down 48 hours prior to surgery just in case you cut yourself which could leave a site for infection.  ?Also, freshly shaved skin may become irritated if using the CHG soap. ? ?Contact lenses, hearing aids and dentures may not be worn into surgery. ? ?Do not bring valuables to the hospital. [facility] is not responsible for any missing/lost belongings or valuables.  ? ?Bring your Bi-PAP to the hospital with you in case you may have to spend the night.  ? ?Notify your doctor if there is any change in your medical condition (cold, fever, infection). ? ?Wear comfortable clothing (specific to your surgery type) to the hospital. ? ?After surgery, you can help prevent lung complications by doing breathing exercises.  ?Take deep breaths and  cough every 1-2 hours. Your doctor may order a device called an Incentive Spirometer to help you take deep breaths. ? ?If you are being discharged the day of surgery, you will not be allowed to drive home. ?You will need a responsible adult (18 years or older) to drive you home and stay with you that night.  ? ?If you are taking public transportation, you will need to have a responsible adult (18 years  or older) with you. ?Please confirm with your physician that it is acceptable to use public transportation.  ? ?Please call the [facility] Dept. at [phone] if you have any questions about these instructions. ? ?Surgery Visitation Policy: ? ?Patients undergoing a surgery or procedure may have one family member or support person with them as long as that person is not COVID-19 positive or experiencing its symptoms.  ?That person may remain in the waiting area during the procedure and may rotate out with other people. ?

## 2021-03-28 NOTE — Telephone Encounter (Signed)
? ?  Pre-operative Risk Assessment  ?  ?Patient Name: Legacie Dillingham  ?DOB: Jun 25, 1960 ?MRN: [id number]  ? ?URGENT REQUESTS ? ?Request for Surgical Clearance   ? ?Procedure:   Right Knee Partial Medial Menisectomy ? ?Date of Surgery:  Clearance 04/01/21                              ?   ?Surgeon:  Dr Leim Fabry ?Surgeon's Group or Practice Name:  [facility]  ?Phone number:  [phone] Alison Murray) ?Fax number:  [phone] ?  ?Type of Clearance Requested:   ?- Medical  ?- Pharmacy:  Hold Rivaroxaban (Xarelto)   ?  ?Type of Anesthesia:   None Listed ?  ?Additional requests/questions:   N/A ? ?Signed, ?Ulice Brilliant T   ?03/28/2021, 4:55 PM  ? ?

## 2021-03-29 ENCOUNTER — Telehealth: Payer: Self-pay | Admitting: *Deleted

## 2021-03-29 ENCOUNTER — Encounter: Payer: Self-pay | Admitting: Physician Assistant

## 2021-03-29 ENCOUNTER — Ambulatory Visit (INDEPENDENT_AMBULATORY_CARE_PROVIDER_SITE_OTHER): Payer: 59 | Admitting: Physician Assistant

## 2021-03-29 DIAGNOSIS — Z01810 Encounter for preprocedural cardiovascular examination: Secondary | ICD-10-CM

## 2021-03-29 NOTE — Telephone Encounter (Signed)
Pt agreeable to plan of care and tele appt for pre op with Angie Duke, PAC today, due to Dent. MED REC AND CONSENT ARE DONE  ?

## 2021-03-29 NOTE — Telephone Encounter (Signed)
?  Patient Consent for Virtual Visit  ? ? ?   ? ?Brittany Bauer has provided verbal consent on 03/29/2021 for a virtual visit (video or telephone). ? ? ?CONSENT FOR VIRTUAL VISIT FOR:  Brittany Bauer  ?By participating in this virtual visit I agree to the following: ? ?I hereby voluntarily request, consent and authorize [facility] and its employed or contracted physicians, physician assistants, nurse practitioners or other licensed health care professionals (the Practitioner), to provide me with telemedicine health care services (the ?Services") as deemed necessary by the treating Practitioner. I acknowledge and consent to receive the Services by the Practitioner via telemedicine. I understand that the telemedicine visit will involve communicating with the Practitioner through live audiovisual communication technology and the disclosure of certain medical information by electronic transmission. I acknowledge that I have been given the opportunity to request an in-person assessment or other available alternative prior to the telemedicine visit and am voluntarily participating in the telemedicine visit. ? ?I understand that I have the right to withhold or withdraw my consent to the use of telemedicine in the course of my care at any time, without affecting my right to future care or treatment, and that the Practitioner or I may terminate the telemedicine visit at any time. I understand that I have the right to inspect all information obtained and/or recorded in the course of the telemedicine visit and may receive copies of available information for a reasonable fee.  I understand that some of the potential risks of receiving the Services via telemedicine include:  ?Delay or interruption in medical evaluation due to technological equipment failure or disruption; ?Information transmitted may not be sufficient (e.g. poor resolution of images) to allow for appropriate medical decision making by the Practitioner; and/or   ?In rare instances, security protocols could fail, causing a breach of personal health information. ? ?Furthermore, I acknowledge that it is my responsibility to provide information about my medical history, conditions and care that is complete and accurate to the best of my ability. I acknowledge that Practitioner's advice, recommendations, and/or decision may be based on factors not within their control, such as incomplete or inaccurate data provided by me or distortions of diagnostic images or specimens that may result from electronic transmissions. I understand that the practice of medicine is not an exact science and that Practitioner makes no warranties or guarantees regarding treatment outcomes. I acknowledge that a copy of this consent can be made available to me via my patient portal ([facility]), or I can request a printed copy by calling the office of [place].   ? ?I understand that my insurance will be billed for this visit.  ? ?I have read or had this consent read to me. ?I understand the contents of this consent, which adequately explains the benefits and risks of the Services being provided via telemedicine.  ?I have been provided ample opportunity to ask questions regarding this consent and the Services and have had my questions answered to my satisfaction. ?I give my informed consent for the services to be provided through the use of telemedicine in my medical care ? ? ? ?

## 2021-03-29 NOTE — Telephone Encounter (Signed)
Patient with diagnosis of afib on Xarelto for anticoagulation.   ? ?Procedure: right knee partial medial menisectomy ?Date of procedure: 04/01/21 ? ?CHA2DS2-VASc Score = 1  ?This indicates a 0.6% annual risk of stroke. ?The patient's score is based upon: ?CHF History: 0 ?HTN History: 0 ?Diabetes History: 0 ?Stroke History: 0 ?Vascular Disease History: 0 ?Age Score: 0 ?Gender Score: 1 ? ?  ?CrCl >171m/min ?Platelet count 188K ? ?Per office protocol, patient can hold Xarelto for 2-3 days prior to procedure.   ?

## 2021-03-29 NOTE — Progress Notes (Signed)
? ?Virtual Visit via Telephone Note  ? ?This visit type was conducted due to national recommendations for restrictions regarding the COVID-19 Pandemic (e.g. social distancing) in an effort to limit this patient's exposure and mitigate transmission in our community.  Due to her co-morbid illnesses, this patient is at least at moderate risk for complications without adequate follow up.  This format is felt to be most appropriate for this patient at this time.  The patient did not have access to video technology/had technical difficulties with video requiring transitioning to audio format only (telephone).  All issues noted in this document were discussed and addressed.  No physical exam could be performed with this format.  Please refer to the patient's chart for her  consent to telehealth for [facility]. ?Evaluation Performed:  Preoperative cardiovascular risk assessment ? ?This visit type was conducted due to national recommendations for restrictions regarding the COVID-19 Pandemic (e.g. social distancing).  This format is felt to be most appropriate for this patient at this time.  All issues noted in this document were discussed and addressed.  No physical exam was performed (except for noted visual exam findings with Video Visits).  Please refer to the patient's chart (MyChart message for video visits and phone note for telephone visits) for the patient's consent to telehealth for [facility]. ?_____________  ? ?Date:  03/29/2021  ? ?Patient ID:  Catia, Todorov 02-May-1960, MRN [id number] ?Patient Location:  ?Home ?Provider location:   ?Office ? ?Primary Care Provider:  Kerin Perna, NP ?Primary Cardiology APP: Malka So PAC ? ?Chief Complaint  ?  ?61 y.o. y/o female with a h/o HLD, obesity, OSA on BiPAP, PAF s/p DCCV 05/14/20 and flecainide, repeat DCCV 05/29/20 who is pending Right Knee Partial Medial Menisectomy and presents today for telephonic preoperative cardiovascular risk  assessment. ? ?Past Medical History  ?  ?Past Medical History:  ?Diagnosis Date  ? Atrial fibrillation ([place])   ? Hyperlipidemia   ? diet controlled  ? Morbid obesity ([place])   ? Obstructive sleep apnea   ? ?Past Surgical History:  ?Procedure Laterality Date  ? BREAST EXCISIONAL BIOPSY Right   ? over 10 years ago  ? CARDIOVERSION N/A 05/14/2020  ? Procedure: CARDIOVERSION;  Surgeon: Sueanne Margarita, MD;  Location[facility] ENDOSCOPY;  Service: Cardiovascular;  Laterality: N/A;  ? CARDIOVERSION N/A 05/29/2020  ? Procedure: CARDIOVERSION;  Surgeon: [name], MD;  Location: [facility] ENDOSCOPY;  Service: Cardiovascular;  Laterality: N/A;  ? COLONOSCOPY  2022  ? TONSILLECTOMY    ? TUBAL LIGATION    ? WISDOM TOOTH EXTRACTION    ? ? ?Allergies ? ?Allergies  ?Allergen Reactions  ? Penicillins Other (See Comments)  ?  As a child- unknown reaction  ? ? ?History of Present Illness  ?  ?Nolia Tschantz is a 61 y.o. female who presents via audio/video conferencing for a telehealth visit today.  Pt was last seen in cardiology clinic on 09/18/20, by Clint Fenton PA-C.  At that time Safire Gordin was doing well.  She is now pending Right Knee Partial Medial Menisectomy.  Since her last visit, she has done well but is limited by her knee pain. Prior to Jan 16 2021 she was able to walk 30 min, complete grocery shopping,climb stairs, and complete at least moderate house work. She is now pending knee surgery. She does not remember an injury.  She has no cardiac complaints, no angina, no changes in her cardiovascular health. ? ? ?Home Medications  ?  ?  Prior to Admission medications   ?Medication Sig Start Date End Date Taking? Authorizing Provider  ?celecoxib (CELEBREX) 200 MG capsule Take 200 mg by mouth at bedtime. 02/27/21   [provider]  ?gemfibrozil (LOPID) 600 MG tablet TAKE 1 TABLET(600 MG) BY MOUTH TWICE DAILY BEFORE A MEAL 02/18/21   Kerin Perna, NP  ?metoprolol succinate (TOPROL-XL) 50 MG 24 hr tablet TAKE 1 TABLET BY  MOUTH EVERY DAY ?Patient taking differently: Take 50 mg by mouth at bedtime. 03/27/21   Troy Sine, MD  ?traMADol (ULTRAM) 50 MG tablet Take 50 mg by mouth every 6 (six) hours as needed for pain. 02/20/21   [provider]  ?XARELTO 20 MG TABS tablet TAKE 1 TABLET(20 MG) BY MOUTH DAILY WITH SUPPER 10/29/20   Fenton, [place] R, PA  ? ? ?Physical Exam  ?  ?Vital Signs:  Carlea Badour does not have vital signs available for review today. ? ?Given telephonic nature of communication, physical exam is limited. ?AAOx3. NAD. Normal affect.  Speech and respirations are unlabored. ? ?Accessory Clinical Findings  ?  ?None ? ?Assessment & Plan  ?  ?1.  Preoperative Cardiovascular Risk Assessment: ? ?She has been limited by knee pain since Jan 16 2021. Prior to Dec 28, she was able to complete 4.0 METS without angina. She does not have a history of ischemic heart disease, heart failure, or stroke.  ? ?Therefore, based on ACC/AHA guidelines, the patient would be at acceptable risk for the planned procedure without further cardiovascular testing.  ? ?The patient was advised that if she develops new symptoms prior to surgery to contact our office to arrange for a follow-up visit, and she verbalized understanding. ? ?I will route this recommendation to the requesting party via Epic fax function. ? ? ? ?2. Anticoagulation hold ?Per our clinical pharmacist: ? ?Patient with diagnosis of afib on Xarelto for anticoagulation.   ?  ?Procedure: right knee partial medial menisectomy ?Date of procedure: 04/01/21 ?  ?CHA2DS2-VASc Score = 1  ?This indicates a 0.6% annual risk of stroke. ?The patient's score is based upon: ?CHF History: 0 ?HTN History: 0 ?Diabetes History: 0 ?Stroke History: 0 ?Vascular Disease History: 0 ?Age Score: 0 ?Gender Score: 1 ?  ?CrCl >132m/min ?Platelet count 188K ?  ?Per office protocol, patient can hold Xarelto for 2-3 days prior to procedure.  ? ? ?COVID-19 Education: ?The signs and symptoms of COVID-19  were discussed with the patient and how to seek care for testing (follow up with PCP or arrange E-visit).  The importance of social distancing was discussed today. ? ?Patient Risk:   ?After full review of this patient's history and clinical status, I feel that he is at least moderate risk for cardiac complications at this time, thus necessitating a telehealth visit sooner than our first available in office visit. ? ?Time:   ?Today, I have spent 8 minutes with the patient with telehealth technology discussing medical history, symptoms, and management plan.   ? ? ?ALedora Bottcher PA ? ?03/29/2021, 1:30 PM ?

## 2021-03-29 NOTE — Telephone Encounter (Signed)
? ? ?  Name: Brittany Bauer  ?DOB: 22-Feb-1960  ?MRN: [id number] ? ?Primary Cardiologist: None ? ? ?Preoperative team, please contact this patient and set up a phone call appointment for further preoperative risk assessment. Please obtain consent and complete medication review. Thank you for your help. ? ? ?Ledora Bottcher, PA-C ?03/29/2021, 12:54 PM ?[phone] ?[place] ?[address] ?[place], [place] [address] ? ? ?

## 2021-04-01 ENCOUNTER — Ambulatory Visit
Admission: RE | Admit: 2021-04-01 | Discharge: 2021-04-01 | Disposition: A | Discharge status: Home / Self Care | Payer: 59 | Attending: Orthopedic Surgery | Admitting: Orthopedic Surgery

## 2021-04-01 ENCOUNTER — Other Ambulatory Visit: Payer: Self-pay

## 2021-04-01 ENCOUNTER — Encounter: Payer: Self-pay | Admitting: Orthopedic Surgery

## 2021-04-01 ENCOUNTER — Ambulatory Visit: Payer: 59 | Admitting: Anesthesiology

## 2021-04-01 ENCOUNTER — Encounter
Admission: RE | Disposition: A | Discharge status: Home / Self Care | Payer: Self-pay | Source: Home / Self Care | Attending: Orthopedic Surgery

## 2021-04-01 DIAGNOSIS — I10 Essential (primary) hypertension: Secondary | ICD-10-CM | POA: Diagnosis not present

## 2021-04-01 DIAGNOSIS — M1711 Unilateral primary osteoarthritis, right knee: Secondary | ICD-10-CM | POA: Insufficient documentation

## 2021-04-01 DIAGNOSIS — X58XXXA Exposure to other specified factors, initial encounter: Secondary | ICD-10-CM | POA: Diagnosis not present

## 2021-04-01 DIAGNOSIS — Z87891 Personal history of nicotine dependence: Secondary | ICD-10-CM | POA: Diagnosis not present

## 2021-04-01 DIAGNOSIS — Z6841 Body Mass Index (BMI) 40.0 and over, adult: Secondary | ICD-10-CM | POA: Insufficient documentation

## 2021-04-01 DIAGNOSIS — G4733 Obstructive sleep apnea (adult) (pediatric): Secondary | ICD-10-CM | POA: Insufficient documentation

## 2021-04-01 DIAGNOSIS — S83241A Other tear of medial meniscus, current injury, right knee, initial encounter: Secondary | ICD-10-CM | POA: Diagnosis present

## 2021-04-01 DIAGNOSIS — I4891 Unspecified atrial fibrillation: Secondary | ICD-10-CM | POA: Diagnosis not present

## 2021-04-01 SURGERY — ARTHROSCOPY, KNEE, WITH MEDIAL MENISCECTOMY
Anesthesia: General | Site: Knee | Laterality: Right

## 2021-04-01 MED ORDER — ACETAMINOPHEN 500 MG PO TABS: 1000.0000 mg | ORAL_TABLET | Freq: Three times a day (TID) | ORAL | 2 refills | Status: AC

## 2021-04-01 MED ORDER — DILTIAZEM HCL 25 MG/5ML IV SOLN
10.0000 mg | Freq: Once | INTRAVENOUS | Status: DC
  Filled 2021-04-01: qty 5

## 2021-04-01 MED ORDER — DEXTROSE 5 % IV SOLN
INTRAVENOUS | Status: DC | PRN
  Administered 2021-04-01: 3 g via INTRAVENOUS

## 2021-04-01 MED ORDER — CEFAZOLIN IN SODIUM CHLORIDE 3-0.9 GM/100ML-% IV SOLN
3.0000 g | INTRAVENOUS | Status: DC
  Filled 2021-04-01: qty 100

## 2021-04-01 MED ORDER — SUCCINYLCHOLINE CHLORIDE 200 MG/10ML IV SOSY
PREFILLED_SYRINGE | INTRAVENOUS | Status: DC | PRN
  Administered 2021-04-01: 120 mg via INTRAVENOUS

## 2021-04-01 MED ORDER — LIDOCAINE-EPINEPHRINE 1 %-1:100000 IJ SOLN
INTRAMUSCULAR | Status: DC | PRN
  Administered 2021-04-01: 10 mL

## 2021-04-01 MED ORDER — PROPOFOL 10 MG/ML IV BOLUS
INTRAVENOUS | Status: DC | PRN
  Administered 2021-04-01: 200 mg via INTRAVENOUS

## 2021-04-01 MED ORDER — GLYCOPYRROLATE 0.2 MG/ML IJ SOLN
INTRAMUSCULAR | Status: DC | PRN
  Administered 2021-04-01: .2 mg via INTRAVENOUS

## 2021-04-01 MED ORDER — ESMOLOL HCL 100 MG/10ML IV SOLN
INTRAVENOUS | Status: DC | PRN
Start: 2021-04-01 — End: 2021-04-01
  Administered 2021-04-01 (×3): 20 mg via INTRAVENOUS

## 2021-04-01 MED ORDER — LABETALOL HCL 5 MG/ML IV SOLN
INTRAVENOUS | Status: DC | PRN
Start: 2021-04-01 — End: 2021-04-01
  Administered 2021-04-01: 5 mg via INTRAVENOUS

## 2021-04-01 MED ORDER — ESMOLOL HCL 100 MG/10ML IV SOLN
INTRAVENOUS | Status: AC
  Filled 2021-04-01: qty 10

## 2021-04-01 MED ORDER — LABETALOL HCL 5 MG/ML IV SOLN
INTRAVENOUS | Status: AC
  Filled 2021-04-01: qty 4

## 2021-04-01 MED ORDER — HYDROCODONE-ACETAMINOPHEN 5-325 MG PO TABS: 1.0000 | ORAL_TABLET | ORAL | 0 refills | Status: AC | PRN

## 2021-04-01 MED ORDER — CHLORHEXIDINE GLUCONATE 0.12 % MT SOLN
OROMUCOSAL | Status: AC
  Administered 2021-04-01: 15 mL via OROMUCOSAL
  Filled 2021-04-01: qty 15

## 2021-04-01 MED ORDER — FAMOTIDINE 20 MG PO TABS: 20.0000 mg | ORAL_TABLET | Freq: Once | ORAL | Status: AC

## 2021-04-01 MED ORDER — HYDROCODONE-ACETAMINOPHEN 5-325 MG PO TABS
ORAL_TABLET | ORAL | Status: AC
  Filled 2021-04-01: qty 1

## 2021-04-01 MED ORDER — ONDANSETRON HCL 4 MG/2ML IJ SOLN
INTRAMUSCULAR | Status: DC | PRN
  Administered 2021-04-01: 4 mg via INTRAVENOUS

## 2021-04-01 MED ORDER — BUPIVACAINE HCL (PF) 0.5 % IJ SOLN
INTRAMUSCULAR | Status: AC
  Filled 2021-04-01: qty 30

## 2021-04-01 MED ORDER — EPINEPHRINE PF 1 MG/ML IJ SOLN
INTRAMUSCULAR | Status: AC
  Filled 2021-04-01: qty 1

## 2021-04-01 MED ORDER — FENTANYL CITRATE (PF) 100 MCG/2ML IJ SOLN
INTRAMUSCULAR | Status: AC
  Administered 2021-04-01: 25 ug via INTRAVENOUS
  Filled 2021-04-01: qty 2

## 2021-04-01 MED ORDER — ONDANSETRON HCL 4 MG/2ML IJ SOLN: 4.0000 mg | Freq: Once | INTRAMUSCULAR | Status: DC | PRN

## 2021-04-01 MED ORDER — DEXAMETHASONE SODIUM PHOSPHATE 10 MG/ML IJ SOLN
INTRAMUSCULAR | Status: AC
  Filled 2021-04-01: qty 1

## 2021-04-01 MED ORDER — ONDANSETRON 4 MG PO TBDP: 4.0000 mg | ORAL_TABLET | Freq: Three times a day (TID) | ORAL | 0 refills | Status: AC | PRN

## 2021-04-01 MED ORDER — HYDROCODONE-ACETAMINOPHEN 5-325 MG PO TABS
1.0000 | ORAL_TABLET | Freq: Once | ORAL | Status: AC
  Administered 2021-04-01: 1 via ORAL

## 2021-04-01 MED ORDER — MIDAZOLAM HCL 2 MG/2ML IJ SOLN
INTRAMUSCULAR | Status: DC | PRN
Start: 2021-04-01 — End: 2021-04-01
  Administered 2021-04-01: 2 mg via INTRAVENOUS

## 2021-04-01 MED ORDER — ORAL CARE MOUTH RINSE: 15.0000 mL | Freq: Once | OROMUCOSAL | Status: AC

## 2021-04-01 MED ORDER — FENTANYL CITRATE (PF) 100 MCG/2ML IJ SOLN
INTRAMUSCULAR | Status: DC | PRN
  Administered 2021-04-01 (×2): 50 ug via INTRAVENOUS

## 2021-04-01 MED ORDER — DEXMEDETOMIDINE (PRECEDEX) IN NS 20 MCG/5ML (4 MCG/ML) IV SYRINGE
PREFILLED_SYRINGE | INTRAVENOUS | Status: DC | PRN
Start: 2021-04-01 — End: 2021-04-01
  Administered 2021-04-01: 12 ug via INTRAVENOUS
  Administered 2021-04-01: 8 ug via INTRAVENOUS

## 2021-04-01 MED ORDER — LACTATED RINGERS IV SOLN
INTRAVENOUS | Status: DC | PRN
  Administered 2021-04-01: 6000 mL

## 2021-04-01 MED ORDER — GLYCOPYRROLATE 0.2 MG/ML IJ SOLN
INTRAMUSCULAR | Status: AC
  Filled 2021-04-01: qty 1

## 2021-04-01 MED ORDER — SUCCINYLCHOLINE CHLORIDE 200 MG/10ML IV SOSY
PREFILLED_SYRINGE | INTRAVENOUS | Status: AC
  Filled 2021-04-01: qty 10

## 2021-04-01 MED ORDER — FAMOTIDINE 20 MG PO TABS
ORAL_TABLET | ORAL | Status: AC
  Administered 2021-04-01: 20 mg via ORAL
  Filled 2021-04-01: qty 1

## 2021-04-01 MED ORDER — PROPOFOL 10 MG/ML IV BOLUS
INTRAVENOUS | Status: AC
  Filled 2021-04-01: qty 20

## 2021-04-01 MED ORDER — DILTIAZEM LOAD VIA INFUSION: 10.0000 mg | Freq: Once | INTRAVENOUS | Status: DC

## 2021-04-01 MED ORDER — LACTATED RINGERS IV SOLN: INTRAVENOUS | Status: DC

## 2021-04-01 MED ORDER — ONDANSETRON HCL 4 MG/2ML IJ SOLN
INTRAMUSCULAR | Status: AC
  Filled 2021-04-01: qty 2

## 2021-04-01 MED ORDER — CHLORHEXIDINE GLUCONATE 0.12 % MT SOLN: 15.0000 mL | Freq: Once | OROMUCOSAL | Status: AC

## 2021-04-01 MED ORDER — FENTANYL CITRATE (PF) 100 MCG/2ML IJ SOLN
INTRAMUSCULAR | Status: AC
  Filled 2021-04-01: qty 2

## 2021-04-01 MED ORDER — 0.9 % SODIUM CHLORIDE (POUR BTL) OPTIME: TOPICAL | Status: DC | PRN

## 2021-04-01 MED ORDER — MIDAZOLAM HCL 2 MG/2ML IJ SOLN
INTRAMUSCULAR | Status: AC
  Filled 2021-04-01: qty 2

## 2021-04-01 MED ORDER — FENTANYL CITRATE (PF) 100 MCG/2ML IJ SOLN
25.0000 ug | INTRAMUSCULAR | Status: DC | PRN
  Administered 2021-04-01 (×2): 25 ug via INTRAVENOUS

## 2021-04-01 MED ORDER — DEXAMETHASONE SODIUM PHOSPHATE 10 MG/ML IJ SOLN
INTRAMUSCULAR | Status: DC | PRN
  Administered 2021-04-01: 10 mg via INTRAVENOUS

## 2021-04-01 SURGICAL SUPPLY — 51 items
ADAPTER IRRIG TUBE 2 SPIKE SOL (ADAPTER) ×4 IMPLANT
ADPR TBG 2 SPK PMP STRL ASCP (ADAPTER) ×2
APL PRP STRL LF DISP 70% ISPRP (MISCELLANEOUS) ×1
BLADE FULL RADIUS 3.5 (BLADE) IMPLANT
BLADE SHAVER 4.5 DBL SERAT CV (CUTTER) ×1 IMPLANT
BLADE SHAVER 4.5X7 STR FR (MISCELLANEOUS) IMPLANT
BLADE SURG SZ11 CARB STEEL (BLADE) ×2 IMPLANT
BNDG COHESIVE 6X5 TAN ST LF (GAUZE/BANDAGES/DRESSINGS) ×2 IMPLANT
BNDG ELASTIC 6X5.8 VLCR STR LF (GAUZE/BANDAGES/DRESSINGS) ×2 IMPLANT
BNDG ESMARK 6X12 TAN STRL LF (GAUZE/BANDAGES/DRESSINGS) ×2 IMPLANT
BUR BR 5.5 WIDE MOUTH (BURR) IMPLANT
CAST PADDING 6X4YD ST 30248 (SOFTGOODS) ×1
CHLORAPREP W/TINT 26 (MISCELLANEOUS) ×2 IMPLANT
COOLER POLAR GLACIER W/PUMP (MISCELLANEOUS) ×2 IMPLANT
CUFF TOURN SGL QUICK 24 (TOURNIQUET CUFF)
CUFF TOURN SGL QUICK 34 (TOURNIQUET CUFF)
CUFF TRNQT CYL 24X4X16.5-23 (TOURNIQUET CUFF) IMPLANT
CUFF TRNQT CYL 34X4.125X (TOURNIQUET CUFF) IMPLANT
DEVICE SUCT BLK HOLE OR FLOOR (MISCELLANEOUS) ×2 IMPLANT
DRAPE ARTHRO LIMB 89X125 STRL (DRAPES) ×4 IMPLANT
DRAPE IMP U-DRAPE 54X76 (DRAPES) ×2 IMPLANT
ELECT REM PT RETURN 9FT ADLT (ELECTROSURGICAL)
ELECTRODE REM PT RTRN 9FT ADLT (ELECTROSURGICAL) IMPLANT
GAUZE SPONGE 4X4 12PLY STRL (GAUZE/BANDAGES/DRESSINGS) ×2 IMPLANT
GLOVE SRG 8 PF TXTR STRL LF DI (GLOVE) ×1 IMPLANT
GLOVE SURG ORTHO LTX SZ8 (GLOVE) ×4 IMPLANT
GLOVE SURG UNDER POLY LF SZ8 (GLOVE) ×2
GOWN STRL REUS W/ TWL LRG LVL3 (GOWN DISPOSABLE) ×1 IMPLANT
GOWN STRL REUS W/ TWL XL LVL3 (GOWN DISPOSABLE) ×1 IMPLANT
GOWN STRL REUS W/TWL LRG LVL3 (GOWN DISPOSABLE) ×2
GOWN STRL REUS W/TWL XL LVL3 (GOWN DISPOSABLE) ×2
IV LACTATED RINGER IRRG 3000ML (IV SOLUTION) ×4
IV LR IRRIG 3000ML ARTHROMATIC (IV SOLUTION) ×4 IMPLANT
KIT TURNOVER KIT A (KITS) ×2 IMPLANT
MANIFOLD NEPTUNE II (INSTRUMENTS) ×4 IMPLANT
MAT ABSORB  FLUID 56X50 GRAY (MISCELLANEOUS) ×2
MAT ABSORB FLUID 56X50 GRAY (MISCELLANEOUS) ×2 IMPLANT
PACK ARTHROSCOPY KNEE (MISCELLANEOUS) ×2 IMPLANT
PAD ABD DERMACEA PRESS 5X9 (GAUZE/BANDAGES/DRESSINGS) ×4 IMPLANT
PAD WRAPON POLAR KNEE (MISCELLANEOUS) ×1 IMPLANT
PADDING CAST COTTON 6X4 ST (SOFTGOODS) ×1 IMPLANT
PENCIL ELECTRO HAND CTR (MISCELLANEOUS) IMPLANT
SPONGE T-LAP 18X18 ~~LOC~~+RFID (SPONGE) ×2 IMPLANT
SUT ETHILON 3-0 FS-10 30 BLK (SUTURE) ×2
SUTURE EHLN 3-0 FS-10 30 BLK (SUTURE) ×1 IMPLANT
TOWEL OR 17X26 4PK STRL BLUE (TOWEL DISPOSABLE) ×4 IMPLANT
TUBING INFLOW SET DBFLO PUMP (TUBING) ×2 IMPLANT
TUBING OUTFLOW SET DBLFO PUMP (TUBING) ×2 IMPLANT
WAND WEREWOLF FLOW 90D (MISCELLANEOUS) IMPLANT
WATER STERILE IRR 500ML POUR (IV SOLUTION) ×2 IMPLANT
WRAPON POLAR PAD KNEE (MISCELLANEOUS) ×2

## 2021-04-01 NOTE — Op Note (Signed)
Operative Note  ?  ?SURGERY DATE: 04/01/2021 ?  ?PRE-OP DIAGNOSIS:  ?1. Right medial meniscus tear ?2. Right patella degenerative changes ?  ?POST-OP DIAGNOSIS:  ?1. Right medial meniscus tear ?2. Right patella degenerative changes ?  ?PROCEDURES:  ?1. Right knee arthroscopy, partial medial meniscectomy ?2.  Right knee arthroscopic chondroplasty of patellofemoral compartment ?  ?SURGEON: Cato Mulligan, MD ?  ?ANESTHESIA: Gen ?  ?ESTIMATED BLOOD LOSS: minimal ?  ?TOTAL IV FLUIDS: per anesthesia ?  ?INDICATION(S):  Brittany Bauer is a 61 y.o. female with signs and symptoms as well as MRI finding of medial meniscus tear.  She has failed extensive conservative management.  She had mechanical symptoms present as well as severe medial sided pain.  She also had a limitation to achieving full extension and flexion.  After discussion of risks, benefits, and alternatives to surgery, the patient elected to proceed. ?  ?OPERATIVE FINDINGS:  ?  ?Examination under anesthesia: A careful examination under anesthesia was performed.  Passive range of motion was: Hyperextension: 1.  Extension: 0.  Flexion: 120.  Lachman: normal. Pivot Shift: normal.  Posterior drawer: normal.  Varus stability in full extension: normal.  Varus stability in 30 degrees of flexion: normal.  Valgus stability in full extension: normal.  Valgus stability in 30 degrees of flexion: normal. ?  ?Intra-operative findings: A thorough arthroscopic examination of the knee was performed.  The findings are: ?1. Suprapatellar pouch: Normal ?2. Undersurface of median ridge: Grade 3-4 degenerative changes measuring approximately 11 x 8 mm ?3. Medial patellar facet: Grade 1 softening ?4. Lateral patellar facet: Grade 1 softening ?5. Trochlea: Grade 1 degenerative changes ?6. Lateral gutter/popliteus tendon: Normal ?7. Hoffa's fat pad: Inflamed ?8. Medial gutter/plica: Normal ?9. ACL: Normal ?10. PCL: Normal ?11. Medial meniscus: Complex tear affecting the posterior  horn/body.  There was a radial tear at the posterior horn/body junction affecting 90% of the meniscus width.  There was a further horizontal component of the posterior horn affecting the superior leaflet.  Furthermore there was a flipped fragment of meniscal tissue within the femoral recess.   ?12. Medial compartment cartilage: Focal area of grade 2 degenerative changes to the medial femoral condyle; extensive grade 2-3 degenerative changes to the central tibial plateau ?13. Lateral meniscus: Normal ?14. Lateral compartment cartilage: Focal area of grade 3 degenerative changes to the tibial plateau measuring approximately 5 x 5 mm; normal lateral femoral condyle ?  ?OPERATIVE REPORT:   ?  ?I identified Brittany Bauer in the pre-operative holding area. I marked the operative knee with my initials. I reviewed the risks and benefits of the proposed surgical intervention and the patient wished to proceed. The patient was transferred to the operative suite and placed in the supine position with all bony prominences padded.  Anesthesia was administered. Appropriate IV antibiotics were administered prior to incision. The extremity was then prepped and draped in standard fashion. A time out was performed confirming the correct extremity, correct patient, and correct procedure. ?  ?Arthroscopy portals were marked. Local anesthetic was injected to the planned portal sites. The anterolateral portal was established with an 11 blade.    ?  ?The arthroscope was placed in the anterolateral portal and then into the suprapatellar pouch. Next, the medial portal was established under needle localization. A diagnostic knee scope was completed with the above findings. The complex medial meniscus tear was identified. ?  ?The MCL was pie-crusted to improve visualization of the posterior horn. The meniscal tear was debrided using  an arthroscopic biter and an oscillating shaver until the meniscus had stable borders.  Approximately 10% of the  meniscus width was present at the posterior horn/body junction after appropriate debridement.  A chondroplasty was performed of the patellofemoral compartment such that there were stable cartilage edges without any loose fragments of cartilage. Arthroscopic fluid was removed from the joint. ?  ?The portals were closed with 3-0 Nylon suture. Sterile dressings included Xeroform, 4x4s, Sof-Rol, and Bias wrap. A Polarcare was placed.  The patient was then awakened and taken to the PACU hemodynamically stable without complication. ?  ?  ?POSTOPERATIVE PLAN: ?The patient will be discharged home today once they meet PACU criteria. Aspirin 325 mg daily was prescribed for 2 weeks for DVT prophylaxis.  Physical therapy will start on POD#3-4. Weight-bearing as tolerated. Follow up in 2 weeks per protocol. ?  ? ?

## 2021-04-01 NOTE — Transfer of Care (Signed)
Immediate Anesthesia Transfer of Care Note ? ?Patient: Maxcine Strong ? ?Procedure(s) Performed: Right knee arthroscopic partial medial meniscectomy (Right: Knee) ? ?Patient Location: PACU ? ?Anesthesia Type:General ? ?Level of Consciousness: drowsy and patient cooperative ? ?Airway & Oxygen Therapy: Patient Spontanous Breathing and Patient connected to face mask oxygen ? ?Post-op Assessment: Report given to RN and Post -op Vital signs reviewed and stable ? ?Post vital signs: Reviewed and stable ? ?Last Vitals:  ?Vitals Value Taken Time  ?BP 110/69 04/01/21 1524  ?Temp    ?Pulse 110 04/01/21 1524  ?Resp 13 04/01/21 1524  ?SpO2 97 % 04/01/21 1524  ?Vitals shown include unvalidated device data. ? ?Last Pain:  ?Vitals:  ? 04/01/21 1206  ?TempSrc: Tympanic  ?PainSc: 0-No pain  ?   ? ?Patients Stated Pain Goal: 0 (04/01/21 1206) ? ?Complications: No notable events documented. ?

## 2021-04-01 NOTE — Discharge Instructions (Addendum)
Arthroscopic Knee Surgery - Partial Meniscectomy ?  ?Post-Op Instructions ?  ?1. Bracing or crutches: Crutches will be provided at the time of discharge from the surgery center if you do not already have them. ?  ?2. Ice: You may be provided with a device [facility]) that allows you to ice the affected area effectively. Otherwise you can ice manually.  ?  ?3. Driving:  Plan on not driving for at least two weeks. Please note that you are advised NOT to drive while taking narcotic pain medications as you may be impaired and unsafe to drive. ?  ?4. Activity: Ankle pumps several times an hour while awake to prevent blood clots. Weight bearing: as tolerated. Use crutches for as needed (usually ~1 week or less) until pain allows you to ambulate without a limp. Bending and straightening the knee is unlimited. Elevate knee above heart level as much as possible for one week. Avoid standing more than 5 minutes (consecutively) for the first week.  Avoid long distance travel for 2 weeks. ? ?5. Medications:  ?- You have been provided a prescription for narcotic pain medicine. After surgery, take 1-2 narcotic tablets every 4 hours if needed for severe pain.  ?- You may take up to '3000mg'$ /day of tylenol (acetaminophen). You can take '1000mg'$  3x/day. Please check your narcotic. If you have acetaminophen in your narcotic (each tablet will be '325mg'$ ), be careful not to exceed a total of '3000mg'$ /day of acetaminophen.  ?- A prescription for anti-nausea medication will be provided in case the narcotic medicine or anesthesia causes nausea - take 1 tablet every 6 hours only if nauseated.  ?- Resume home Xarelto the day after surgery at your normal dose. ? ?6. Bandages: The physical therapist should change the bandages at the first post-op appointment. If needed, the dressing supplies have been provided to you. ?  ?7. Physical Therapy: 1-2 times per week for 6 weeks. Therapy typically starts on post operative Day 3 or 4. You have been provided  an order for physical therapy. The therapist will provide home exercises. ?  ?8. Work: May return to full work usually around 2 weeks after 1st post-operative visit. May do light duty/desk job in approximately 1-2 weeks when off of narcotics, pain is well-controlled, and swelling has decreased. Labor intensive jobs may require 4-6 weeks to return.  ?  ?  ?9. Post-Op Appointments: ?Your first post-op appointment will be with Dr. Posey Pronto in approximately 2 weeks time.  ?  ?If you find that they have not been scheduled please call the Orthopaedic Appointment front desk at [phone]. ?AMBULATORY SURGERY  ?DISCHARGE INSTRUCTIONS ? ? ?The drugs that you were given will stay in your system until tomorrow so for the next 24 hours you should not: ? ?Drive an automobile ?Make any legal decisions ?Drink any alcoholic beverage ? ? ?You may resume regular meals tomorrow.  Today it is better to start with liquids and gradually work up to solid foods. ? ?You may eat anything you prefer, but it is better to start with liquids, then soup and crackers, and gradually work up to solid foods. ? ? ?Please notify your doctor immediately if you have any unusual bleeding, trouble breathing, redness and pain at the surgery site, drainage, fever, or pain not relieved by medication. ? ? ? ?Additional Instructions: ? ? ? ? ? ? ? ?Please contact your physician with any problems or Same Day Surgery at [phone], Monday through Friday 6 am to 4 pm, or [facility] number at [phone].  ?

## 2021-04-01 NOTE — H&P (Signed)
Paper H&P to be scanned into permanent record. H&P reviewed. No significant changes noted.  

## 2021-04-01 NOTE — Anesthesia Procedure Notes (Signed)
Procedure Name: Intubation ?Date/Time: 04/01/2021 2:13 PM ?Performed by: Rolla Plate, CRNA ?Pre-anesthesia Checklist: Patient identified, Patient being monitored, Timeout performed, Emergency Drugs available and Suction available ?Patient Re-evaluated:Patient Re-evaluated prior to induction ?Oxygen Delivery Method: Circle system utilized ?Preoxygenation: Pre-oxygenation with 100% oxygen ?Induction Type: IV induction and Rapid sequence ?Ventilation: Mask ventilation without difficulty ?Laryngoscope Size: 3 and McGraph ?Grade View: Grade I ?Tube type: Oral ?Tube size: 7.0 mm ?Number of attempts: 1 ?Airway Equipment and Method: Stylet and Video-laryngoscopy ?Placement Confirmation: ETT inserted through vocal cords under direct vision, positive ETCO2 and breath sounds checked- equal and bilateral ?Secured at: 21 cm ?Tube secured with: Tape ?Dental Injury: Teeth and Oropharynx as per pre-operative assessment  ? ? ? ? ?

## 2021-04-01 NOTE — Anesthesia Preprocedure Evaluation (Signed)
Anesthesia Evaluation  ?Patient identified by MRN, date of birth, ID band ?Patient awake ? ? ? ?Reviewed: ?Allergy & Precautions, NPO status , Patient's Chart, lab work & pertinent test results ? ?Airway ?Mallampati: III ? ?TM Distance: >3 FB ?Neck ROM: full ? ? ? Dental ? ?(+) Edentulous Upper, Edentulous Lower ?  ?Pulmonary ?neg pulmonary ROS, sleep apnea and Continuous Positive Airway Pressure Ventilation , former smoker,  ?  ?Pulmonary exam normal ? ?+ decreased breath sounds ? ? ? ? ? Cardiovascular ?Exercise Tolerance: Good ?hypertension, negative cardio ROS ?Normal cardiovascular exam+ dysrhythmias Atrial Fibrillation  ?Rhythm:Irregular Rate:Abnormal ? ? ?  ?Neuro/Psych ?negative neurological ROS ? negative psych ROS  ? GI/Hepatic ?negative GI ROS, Neg liver ROS,   ?Endo/Other  ?negative endocrine ROSMorbid obesity ? Renal/GU ?negative Renal ROS  ? ?  ?Musculoskeletal ? ? Abdominal ?(+) + obese,   ?Peds ?negative pediatric ROS ?(+)  Hematology ?negative hematology ROS ?(+)   ?Anesthesia Other Findings ?Past Medical History: ?No date: Atrial fibrillation ([place]) ?No date: Hyperlipidemia ?    Comment:  diet controlled ?No date: Morbid obesity ([place]) ?No date: Obstructive sleep apnea ? ?Past Surgical History: ?No date: BREAST EXCISIONAL BIOPSY; Right ?    Comment:  over 10 years ago ?05/14/2020: CARDIOVERSION; N/A ?    Comment:  Procedure: CARDIOVERSION;  Surgeon: Sueanne Margarita, MD; ?             Location: [place];  Service: Cardiovascular;   ?             Laterality: N/A; ?05/29/2020: CARDIOVERSION; N/A ?    Comment:  Procedure: CARDIOVERSION;  Surgeon: [name],  ?             MD;  Location: [place];  Service: Cardiovascular;   ?             Laterality: N/A; ?2022: COLONOSCOPY ?No date: TONSILLECTOMY ?No date: TUBAL LIGATION ?No date: WISDOM TOOTH EXTRACTION ? ?BMI   ? Body Mass Index: 49.07 kg/m?  ?  ? ? Reproductive/Obstetrics ?negative OB ROS ? ?   ? ? ? ? ? ? ? ? ? ? ? ? ? ?  ?  ? ? ? ? ? ? ? ? ?Anesthesia Physical ?Anesthesia Plan ? ?ASA: 3 ? ?Anesthesia Plan: General  ? ?Post-op Pain Management:   ? ?Induction: Intravenous ? ?PONV Risk Score and Plan: 1 and Ondansetron and Dexamethasone ? ?Airway Management Planned: Oral ETT ? ?Additional Equipment:  ? ?Intra-op Plan:  ? ?Post-operative Plan: Extubation in OR ? ?Informed Consent: I have reviewed the patients History and Physical, chart, labs and discussed the procedure including the risks, benefits and alternatives for the proposed anesthesia with the patient or authorized representative who has indicated his/her understanding and acceptance.  ? ? ? ?Dental Advisory Given ? ?Plan Discussed with: CRNA and Surgeon ? ?Anesthesia Plan Comments:   ? ? ? ? ? ? ?Anesthesia Quick Evaluation ? ?

## 2021-04-02 ENCOUNTER — Encounter: Payer: Self-pay | Admitting: Orthopedic Surgery

## 2021-04-03 NOTE — Anesthesia Postprocedure Evaluation (Signed)
Anesthesia Post Note ? ?Patient: Brittany Bauer ? ?Procedure(s) Performed: Right knee arthroscopic partial medial meniscectomy.  Right knee arthroscopic chondroplasty of patellofemoral compartment (Right: Knee) ? ?Patient location during evaluation: PACU ?Anesthesia Type: General ?Level of consciousness: awake and alert ?Pain management: pain level controlled ?Vital Signs Assessment: post-procedure vital signs reviewed and stable ?Respiratory status: spontaneous breathing, nonlabored ventilation, respiratory function stable and patient connected to nasal cannula oxygen ?Cardiovascular status: blood pressure returned to baseline and stable ?Postop Assessment: no apparent nausea or vomiting ?Anesthetic complications: no ? ? ?No notable events documented. ? ? ?Last Vitals:  ?Vitals:  ? 04/01/21 1546 04/01/21 1603  ?BP: 102/70 (!) 144/90  ?Pulse: 82 88  ?Resp: 10 15  ?Temp:  (!) 36.1 ?C  ?SpO2: 92% 94%  ?  ?Last Pain:  ?Vitals:  ? 04/01/21 1603  ?TempSrc: Temporal  ?PainSc: 0-No pain  ? ? ?  ?  ?  ?  ?  ?  ? ?[name] ? ? ? ? ?

## 2021-04-09 ENCOUNTER — Encounter: Payer: Self-pay | Admitting: Orthopedic Surgery

## 2021-05-21 ENCOUNTER — Other Ambulatory Visit: Payer: Self-pay | Admitting: Orthopedic Surgery

## 2021-05-21 DIAGNOSIS — S83241D Other tear of medial meniscus, current injury, right knee, subsequent encounter: Secondary | ICD-10-CM

## 2021-11-27 ENCOUNTER — Other Ambulatory Visit (HOSPITAL_COMMUNITY): Payer: Self-pay | Admitting: Physician Assistant

## 2021-12-10 ENCOUNTER — Other Ambulatory Visit (HOSPITAL_COMMUNITY): Payer: Self-pay | Admitting: Physician Assistant

## 2022-02-15 ENCOUNTER — Other Ambulatory Visit (HOSPITAL_COMMUNITY): Payer: Self-pay | Admitting: Physician Assistant

## 2022-02-21 ENCOUNTER — Other Ambulatory Visit (HOSPITAL_COMMUNITY): Payer: Self-pay | Admitting: *Deleted

## 2022-02-21 MED ORDER — RIVAROXABAN 20 MG PO TABS: 20.0000 mg | ORAL_TABLET | Freq: Every day | ORAL | 0 refills | Status: AC

## 2022-03-03 ENCOUNTER — Ambulatory Visit (HOSPITAL_COMMUNITY): Payer: 59 | Admitting: Physician Assistant

## 2022-03-28 IMAGING — MG MM DIGITAL SCREENING BILAT W/ TOMO AND CAD
6 of 12 series · 6 of 36 positions shown · non-contrast
Comparison: None.

CLINICAL DATA: Screening.

EXAM:
DIGITAL SCREENING BILATERAL MAMMOGRAM WITH TOMOSYNTHESIS AND CAD
TECHNIQUE: Bilateral screening digital craniocaudal and mediolateral oblique
mammograms were obtained. Bilateral screening digital breast
tomosynthesis was performed. The images were evaluated with
computer-aided detection.

[R MLO synth-2D (1 of 2)]
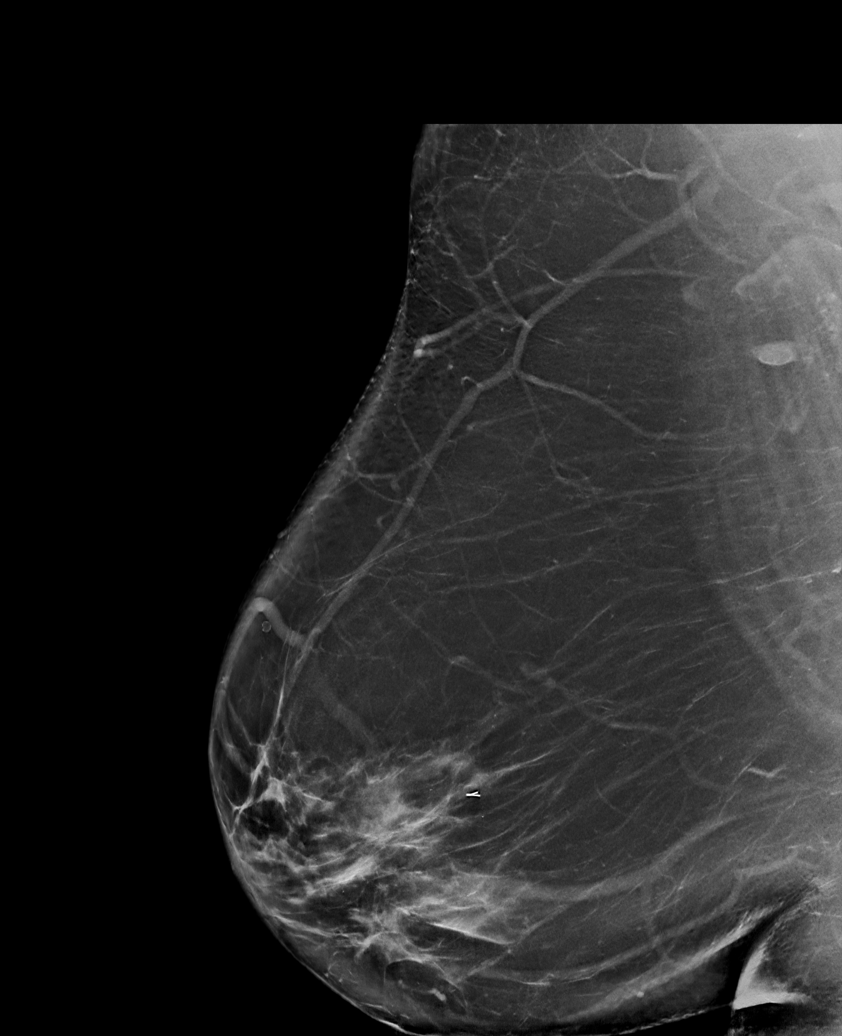

[L CC synth-2D (1 of 2)]
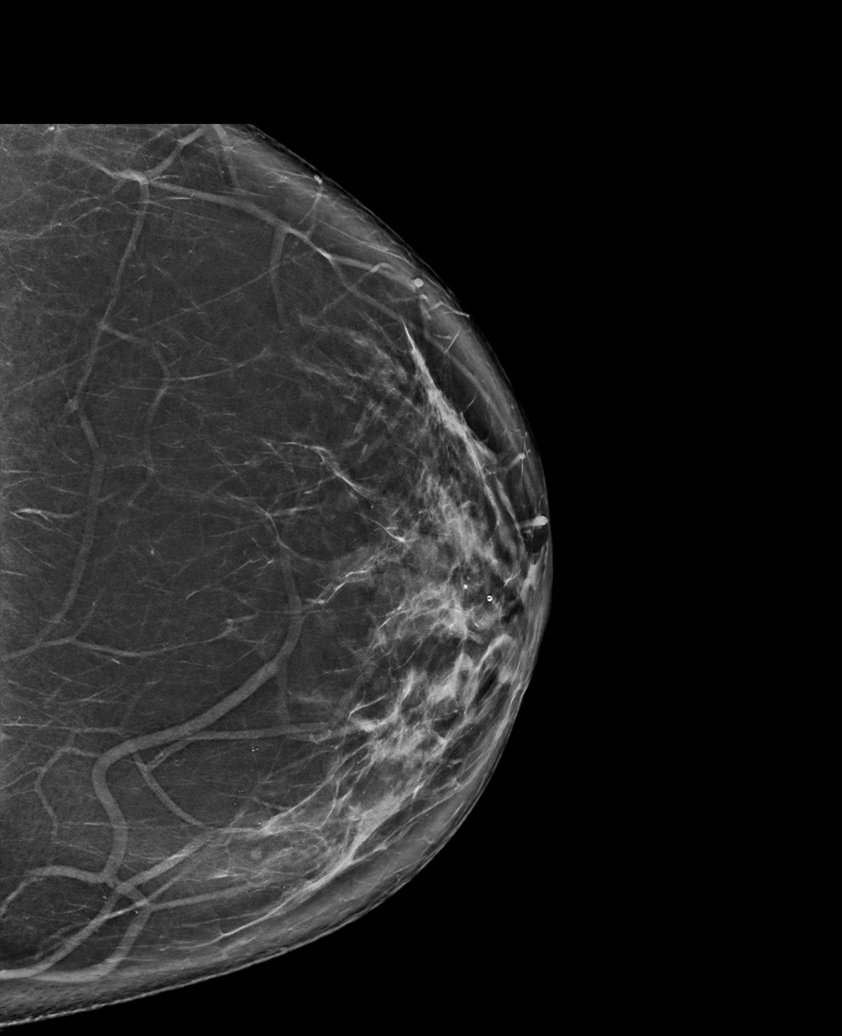

[R CC synth-2D]
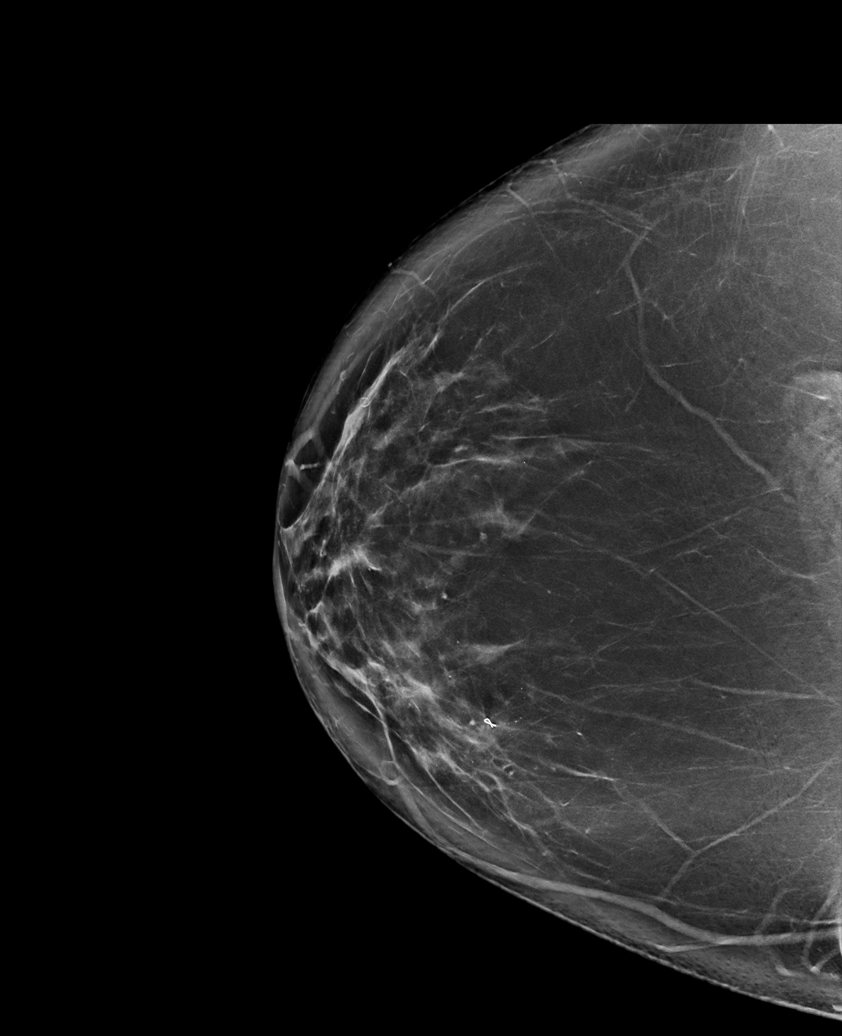

[R MLO synth-2D (2 of 2)]
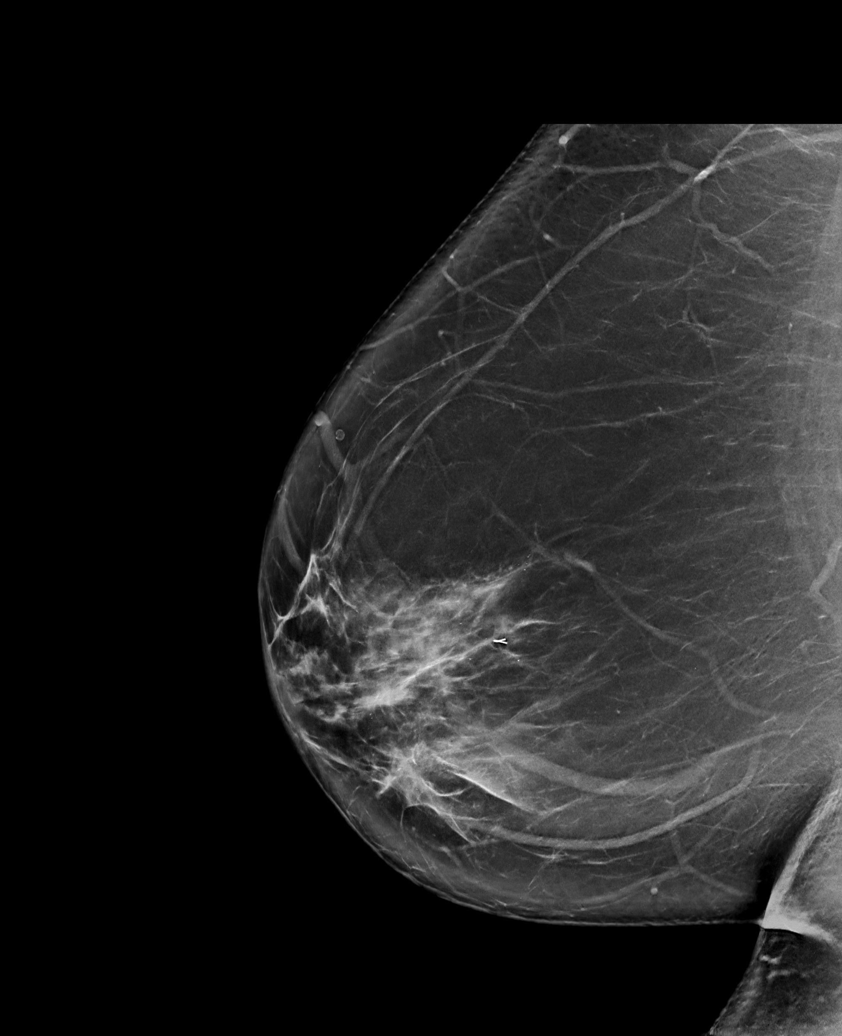

[L MLO synth-2D]
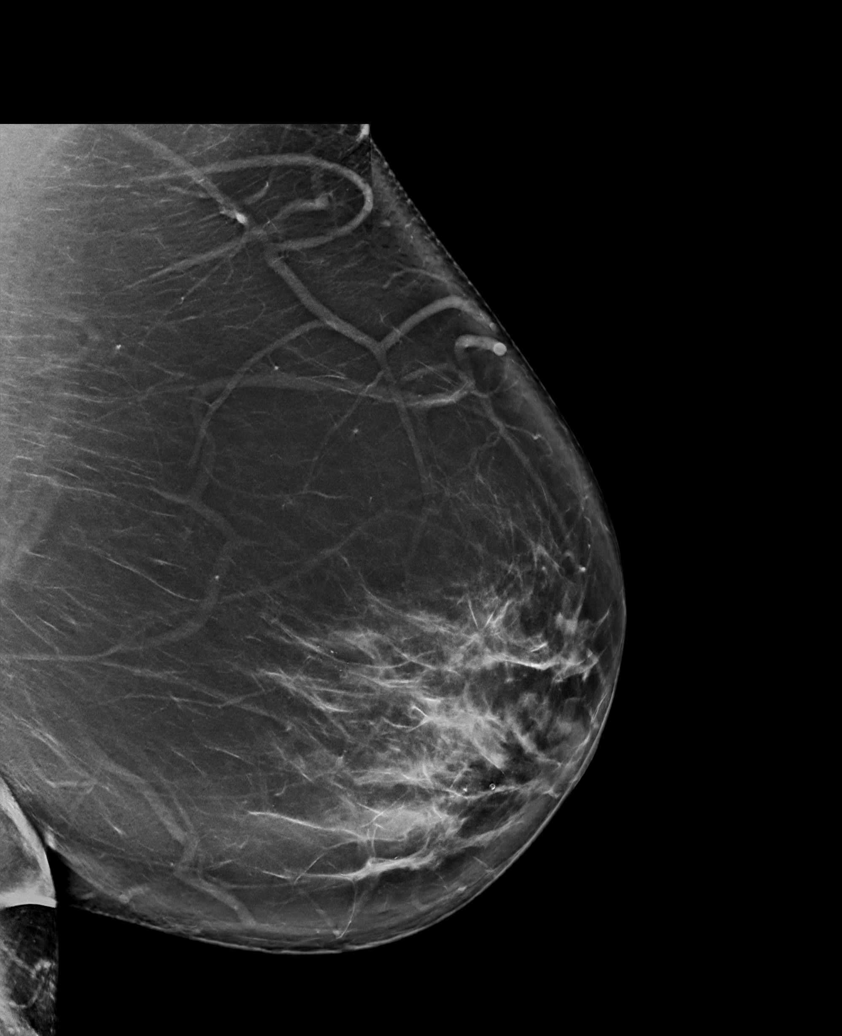

[L CC synth-2D (2 of 2)]
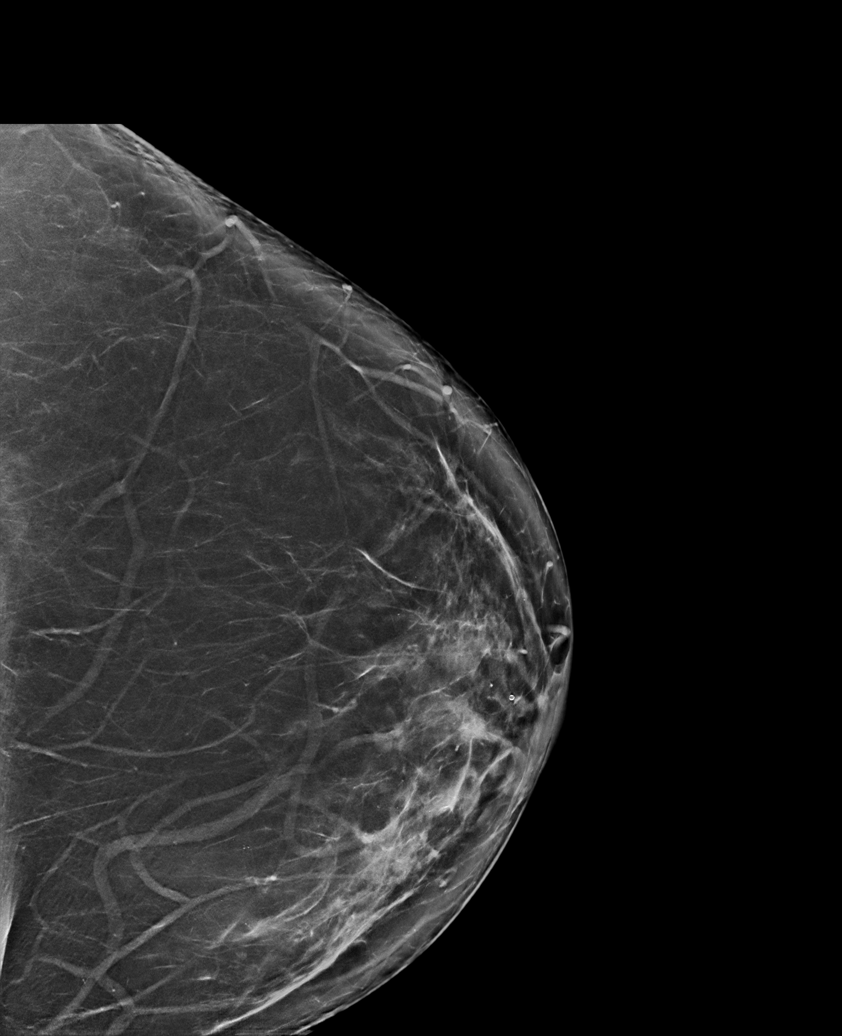

[6 of 36 positions shown; findings below may reference images not displayed]

ACR Breast Density Category b: There are scattered areas of
fibroglandular density.
FINDINGS: There are no findings suspicious for malignancy.
IMPRESSION: No mammographic evidence of malignancy. A result letter of this
screening mammogram will be mailed directly to the patient.

RECOMMENDATION:
Screening mammogram in one year. (Code:XG-X-X7B)

BI-RADS CATEGORY  1: Negative.

## 2022-10-08 ENCOUNTER — Emergency Department: Payer: 59

## 2022-10-08 ENCOUNTER — Emergency Department
Admission: EM | Admit: 2022-10-08 | Discharge: 2022-10-08 | Disposition: A | Discharge status: Home / Self Care | Payer: 59 | Attending: Emergency Medicine | Admitting: Emergency Medicine

## 2022-10-08 DIAGNOSIS — T07XXXA Unspecified multiple injuries, initial encounter: Secondary | ICD-10-CM

## 2022-10-08 DIAGNOSIS — Z7901 Long term (current) use of anticoagulants: Secondary | ICD-10-CM | POA: Diagnosis not present

## 2022-10-08 DIAGNOSIS — S46011A Strain of muscle(s) and tendon(s) of the rotator cuff of right shoulder, initial encounter: Secondary | ICD-10-CM | POA: Diagnosis not present

## 2022-10-08 DIAGNOSIS — S46911A Strain of unspecified muscle, fascia and tendon at shoulder and upper arm level, right arm, initial encounter: Secondary | ICD-10-CM

## 2022-10-08 DIAGNOSIS — Y92410 Unspecified street and highway as the place of occurrence of the external cause: Secondary | ICD-10-CM | POA: Diagnosis not present

## 2022-10-08 DIAGNOSIS — S4991XA Unspecified injury of right shoulder and upper arm, initial encounter: Secondary | ICD-10-CM | POA: Diagnosis present

## 2022-10-08 MED ORDER — BACLOFEN 10 MG PO TABS: 10.0000 mg | ORAL_TABLET | Freq: Three times a day (TID) | ORAL | 0 refills | Status: AC
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
## 2022-10-08 NOTE — Discharge Instructions (Signed)
Apply ice to the areas that are bruised. Take muscle relaxer.  Over-the-counter Tylenol. Follow-up with your regular doctor if not improving 3 days.  Return emergency department worsening

## 2023-05-21 ENCOUNTER — Other Ambulatory Visit: Payer: Self-pay | Admitting: Internal Medicine

## 2023-05-21 DIAGNOSIS — Z1231 Encounter for screening mammogram for malignant neoplasm of breast: Secondary | ICD-10-CM

## 2023-06-23 ENCOUNTER — Ambulatory Visit
Admission: RE | Admit: 2023-06-23 | Discharge: 2023-06-23 | Disposition: A | Discharge status: Home / Self Care | Source: Ambulatory Visit | Attending: Internal Medicine | Admitting: Internal Medicine

## 2023-06-23 DIAGNOSIS — Z1231 Encounter for screening mammogram for malignant neoplasm of breast: Secondary | ICD-10-CM | POA: Diagnosis present

## 2023-12-29 ENCOUNTER — Telehealth: Payer: Self-pay

## 2023-12-29 NOTE — Telephone Encounter (Signed)
 Attempted to reach patient concerning colonoscopy recall; unable to speak with patient;  left message and number to the office for patient to call back and schedule appts;    Need updated height and weight for accurate BMI (last BMI>50); if greater than 50, then note will need to be sent to provider;  Also need to know if the patient is still taking XARELTO , if so then patient will need OV instead of PV for procedure;   If NOT on Xarelto  and BMI less than 50, then proceed with PV and procedure to be scheduled;

## 2024-01-13 NOTE — Telephone Encounter (Signed)
 Called and spoke with patient-patient reports she was not happy with her care here with [facility] and is transferring her care to a different facility; information noted on recall;
